# Patient Record
Sex: Female | Born: 1945 | Race: White | Hispanic: No | Marital: Married | Smoking: Never smoker
Health system: Southern US, Community
[De-identification: ages and names within clinical notes are randomized; demographics above are authoritative.]

## PROBLEM LIST (undated history)

## (undated) DIAGNOSIS — K219 Gastro-esophageal reflux disease without esophagitis: Secondary | ICD-10-CM

## (undated) DIAGNOSIS — I10 Essential (primary) hypertension: Secondary | ICD-10-CM

## (undated) DIAGNOSIS — C50919 Malignant neoplasm of unspecified site of unspecified female breast: Secondary | ICD-10-CM

## (undated) DIAGNOSIS — K449 Diaphragmatic hernia without obstruction or gangrene: Secondary | ICD-10-CM

## (undated) DIAGNOSIS — I712 Thoracic aortic aneurysm, without rupture, unspecified: Secondary | ICD-10-CM

## (undated) DIAGNOSIS — N200 Calculus of kidney: Secondary | ICD-10-CM

## (undated) DIAGNOSIS — M810 Age-related osteoporosis without current pathological fracture: Secondary | ICD-10-CM

## (undated) HISTORY — PX: CHOLECYSTECTOMY: SHX55

## (undated) HISTORY — PX: HEMORROIDECTOMY: SUR656

## (undated) HISTORY — DX: Thoracic aortic aneurysm, without rupture, unspecified: I71.20

## (undated) HISTORY — DX: Essential (primary) hypertension: I10

## (undated) HISTORY — PX: GASTRIC FUNDOPLICATION: SHX226

## (undated) HISTORY — DX: Calculus of kidney: N20.0

## (undated) HISTORY — DX: Gastro-esophageal reflux disease without esophagitis: K21.9

## (undated) HISTORY — DX: Malignant neoplasm of unspecified site of unspecified female breast: C50.919

## (undated) HISTORY — DX: Thoracic aortic aneurysm, without rupture: I71.2

## (undated) HISTORY — DX: Diaphragmatic hernia without obstruction or gangrene: K44.9

## (undated) HISTORY — PX: ROTATOR CUFF REPAIR: SHX139

## (undated) HISTORY — PX: BREAST RECONSTRUCTION: SHX9

---

## 1982-09-26 HISTORY — PX: ABDOMINAL HYSTERECTOMY: SHX81

## 1998-03-08 ENCOUNTER — Ambulatory Visit (HOSPITAL_COMMUNITY): Admission: RE | Admit: 1998-03-08 | Discharge: 1998-03-08 | Payer: Self-pay | Admitting: Neurological Surgery

## 1999-06-03 ENCOUNTER — Other Ambulatory Visit: Admission: RE | Admit: 1999-06-03 | Discharge: 1999-06-03 | Payer: Self-pay | Admitting: Obstetrics and Gynecology

## 1999-07-08 ENCOUNTER — Ambulatory Visit (HOSPITAL_COMMUNITY): Admission: RE | Admit: 1999-07-08 | Discharge: 1999-07-08 | Payer: Self-pay | Admitting: Gastroenterology

## 1999-08-05 ENCOUNTER — Encounter: Payer: Self-pay | Admitting: Obstetrics and Gynecology

## 1999-08-05 ENCOUNTER — Encounter: Admission: RE | Admit: 1999-08-05 | Discharge: 1999-08-05 | Payer: Self-pay | Admitting: Obstetrics and Gynecology

## 1999-10-29 ENCOUNTER — Ambulatory Visit (HOSPITAL_COMMUNITY): Admission: RE | Admit: 1999-10-29 | Discharge: 1999-10-29 | Payer: Self-pay | Admitting: Gastroenterology

## 1999-11-11 ENCOUNTER — Encounter: Admission: RE | Admit: 1999-11-11 | Discharge: 1999-11-11 | Payer: Self-pay | Admitting: Gastroenterology

## 1999-11-11 ENCOUNTER — Encounter: Payer: Self-pay | Admitting: Gastroenterology

## 2000-05-04 ENCOUNTER — Encounter: Payer: Self-pay | Admitting: Gastroenterology

## 2000-05-04 ENCOUNTER — Encounter: Admission: RE | Admit: 2000-05-04 | Discharge: 2000-05-04 | Payer: Self-pay | Admitting: Gastroenterology

## 2000-07-12 ENCOUNTER — Encounter: Admission: RE | Admit: 2000-07-12 | Discharge: 2000-07-12 | Payer: Self-pay | Admitting: Internal Medicine

## 2000-07-12 ENCOUNTER — Encounter: Payer: Self-pay | Admitting: Internal Medicine

## 2000-08-07 ENCOUNTER — Ambulatory Visit (HOSPITAL_COMMUNITY): Admission: RE | Admit: 2000-08-07 | Discharge: 2000-08-07 | Payer: Self-pay | Admitting: Gastroenterology

## 2000-08-10 ENCOUNTER — Encounter: Admission: RE | Admit: 2000-08-10 | Discharge: 2000-08-10 | Payer: Self-pay | Admitting: Obstetrics and Gynecology

## 2000-08-10 ENCOUNTER — Encounter: Payer: Self-pay | Admitting: Obstetrics and Gynecology

## 2000-08-24 ENCOUNTER — Encounter: Payer: Self-pay | Admitting: General Surgery

## 2000-08-24 ENCOUNTER — Encounter: Admission: RE | Admit: 2000-08-24 | Discharge: 2000-08-24 | Payer: Self-pay | Admitting: General Surgery

## 2000-10-06 ENCOUNTER — Inpatient Hospital Stay (HOSPITAL_COMMUNITY): Admission: RE | Admit: 2000-10-06 | Discharge: 2000-10-09 | Payer: Self-pay | Admitting: General Surgery

## 2000-12-05 ENCOUNTER — Ambulatory Visit (HOSPITAL_COMMUNITY): Admission: RE | Admit: 2000-12-05 | Discharge: 2000-12-05 | Payer: Self-pay | Admitting: Urology

## 2000-12-07 ENCOUNTER — Ambulatory Visit (HOSPITAL_COMMUNITY): Admission: RE | Admit: 2000-12-07 | Discharge: 2000-12-07 | Payer: Self-pay | Admitting: Urology

## 2000-12-07 ENCOUNTER — Encounter: Payer: Self-pay | Admitting: Urology

## 2000-12-11 ENCOUNTER — Encounter: Payer: Self-pay | Admitting: Emergency Medicine

## 2000-12-11 ENCOUNTER — Emergency Department (HOSPITAL_COMMUNITY): Admission: EM | Admit: 2000-12-11 | Discharge: 2000-12-11 | Payer: Self-pay | Admitting: Internal Medicine

## 2001-01-04 ENCOUNTER — Encounter: Payer: Self-pay | Admitting: Internal Medicine

## 2001-01-04 ENCOUNTER — Encounter: Admission: RE | Admit: 2001-01-04 | Discharge: 2001-01-04 | Payer: Self-pay | Admitting: Internal Medicine

## 2001-01-11 ENCOUNTER — Encounter: Payer: Self-pay | Admitting: General Surgery

## 2001-01-11 ENCOUNTER — Encounter: Admission: RE | Admit: 2001-01-11 | Discharge: 2001-01-11 | Payer: Self-pay | Admitting: General Surgery

## 2001-02-08 ENCOUNTER — Other Ambulatory Visit: Admission: RE | Admit: 2001-02-08 | Discharge: 2001-02-08 | Payer: Self-pay | Admitting: Obstetrics and Gynecology

## 2001-08-13 ENCOUNTER — Encounter: Admission: RE | Admit: 2001-08-13 | Discharge: 2001-08-13 | Payer: Self-pay | Admitting: Internal Medicine

## 2001-08-13 ENCOUNTER — Encounter: Payer: Self-pay | Admitting: Internal Medicine

## 2002-02-21 ENCOUNTER — Other Ambulatory Visit: Admission: RE | Admit: 2002-02-21 | Discharge: 2002-02-21 | Payer: Self-pay | Admitting: Obstetrics and Gynecology

## 2002-03-01 ENCOUNTER — Encounter: Payer: Self-pay | Admitting: Internal Medicine

## 2002-03-01 ENCOUNTER — Encounter: Admission: RE | Admit: 2002-03-01 | Discharge: 2002-03-01 | Payer: Self-pay | Admitting: Internal Medicine

## 2002-04-25 ENCOUNTER — Ambulatory Visit (HOSPITAL_COMMUNITY): Admission: RE | Admit: 2002-04-25 | Discharge: 2002-04-25 | Payer: Self-pay | Admitting: Gastroenterology

## 2002-09-06 ENCOUNTER — Encounter: Payer: Self-pay | Admitting: Obstetrics and Gynecology

## 2002-09-06 ENCOUNTER — Encounter: Admission: RE | Admit: 2002-09-06 | Discharge: 2002-09-06 | Payer: Self-pay | Admitting: Obstetrics and Gynecology

## 2003-05-01 ENCOUNTER — Encounter: Admission: RE | Admit: 2003-05-01 | Discharge: 2003-05-01 | Payer: Self-pay | Admitting: Internal Medicine

## 2003-05-01 ENCOUNTER — Encounter: Payer: Self-pay | Admitting: Internal Medicine

## 2003-11-26 ENCOUNTER — Encounter: Admission: RE | Admit: 2003-11-26 | Discharge: 2003-11-26 | Payer: Self-pay | Admitting: Internal Medicine

## 2003-11-28 ENCOUNTER — Ambulatory Visit (HOSPITAL_COMMUNITY)
Admission: RE | Admit: 2003-11-28 | Discharge: 2003-11-28 | Payer: Self-pay | Admitting: Thoracic Surgery (Cardiothoracic Vascular Surgery)

## 2004-12-10 ENCOUNTER — Ambulatory Visit (HOSPITAL_COMMUNITY): Admission: RE | Admit: 2004-12-10 | Discharge: 2004-12-10 | Payer: Self-pay | Admitting: Gastroenterology

## 2004-12-17 ENCOUNTER — Encounter: Admission: RE | Admit: 2004-12-17 | Discharge: 2004-12-17 | Payer: Self-pay | Admitting: Obstetrics and Gynecology

## 2004-12-24 ENCOUNTER — Encounter: Admission: RE | Admit: 2004-12-24 | Discharge: 2004-12-24 | Payer: Self-pay | Admitting: Internal Medicine

## 2006-01-24 ENCOUNTER — Encounter: Admission: RE | Admit: 2006-01-24 | Discharge: 2006-01-24 | Payer: Self-pay | Admitting: Internal Medicine

## 2006-01-30 ENCOUNTER — Encounter: Admission: RE | Admit: 2006-01-30 | Discharge: 2006-01-30 | Payer: Self-pay | Admitting: Dermatology

## 2006-12-14 ENCOUNTER — Encounter: Admission: RE | Admit: 2006-12-14 | Discharge: 2006-12-14 | Payer: Self-pay | Admitting: Gastroenterology

## 2007-01-11 ENCOUNTER — Encounter: Admission: RE | Admit: 2007-01-11 | Discharge: 2007-01-11 | Payer: Self-pay | Admitting: Gastroenterology

## 2007-02-14 ENCOUNTER — Encounter: Admission: RE | Admit: 2007-02-14 | Discharge: 2007-02-14 | Payer: Self-pay | Admitting: Obstetrics and Gynecology

## 2007-02-20 ENCOUNTER — Encounter: Admission: RE | Admit: 2007-02-20 | Discharge: 2007-02-20 | Payer: Self-pay | Admitting: Obstetrics and Gynecology

## 2007-02-27 ENCOUNTER — Ambulatory Visit (HOSPITAL_COMMUNITY): Admission: RE | Admit: 2007-02-27 | Discharge: 2007-02-28 | Payer: Self-pay | Admitting: General Surgery

## 2007-02-27 ENCOUNTER — Encounter (INDEPENDENT_AMBULATORY_CARE_PROVIDER_SITE_OTHER): Payer: Self-pay | Admitting: General Surgery

## 2007-02-27 ENCOUNTER — Encounter: Admission: RE | Admit: 2007-02-27 | Discharge: 2007-02-27 | Payer: Self-pay | Admitting: General Surgery

## 2007-04-24 ENCOUNTER — Encounter: Admission: RE | Admit: 2007-04-24 | Discharge: 2007-04-24 | Payer: Self-pay | Admitting: Internal Medicine

## 2008-03-03 ENCOUNTER — Encounter: Admission: RE | Admit: 2008-03-03 | Discharge: 2008-03-03 | Payer: Self-pay | Admitting: Obstetrics and Gynecology

## 2008-03-10 ENCOUNTER — Encounter: Admission: RE | Admit: 2008-03-10 | Discharge: 2008-03-10 | Payer: Self-pay | Admitting: Obstetrics and Gynecology

## 2008-03-13 ENCOUNTER — Encounter: Admission: RE | Admit: 2008-03-13 | Discharge: 2008-03-13 | Payer: Self-pay | Admitting: Obstetrics and Gynecology

## 2008-03-26 HISTORY — PX: MASTECTOMY: SHX3

## 2008-04-04 ENCOUNTER — Encounter (INDEPENDENT_AMBULATORY_CARE_PROVIDER_SITE_OTHER): Payer: Self-pay | Admitting: General Surgery

## 2008-04-05 ENCOUNTER — Inpatient Hospital Stay (HOSPITAL_COMMUNITY): Admission: RE | Admit: 2008-04-05 | Discharge: 2008-04-07 | Payer: Self-pay | Admitting: General Surgery

## 2008-04-11 ENCOUNTER — Ambulatory Visit: Payer: Self-pay | Admitting: Oncology

## 2008-04-23 LAB — CBC WITH DIFFERENTIAL/PLATELET
BASO%: 0.8 % (ref 0.0–2.0)
EOS%: 4.9 % (ref 0.0–7.0)
HCT: 36.9 % (ref 34.8–46.6)
LYMPH%: 41.9 % (ref 14.0–48.0)
MCH: 32.1 pg (ref 26.0–34.0)
MCHC: 33.4 g/dL (ref 32.0–36.0)
MCV: 96.3 fL (ref 81.0–101.0)
MONO#: 0.3 10*3/uL (ref 0.1–0.9)
MONO%: 6.6 % (ref 0.0–13.0)
NEUT%: 45.8 % (ref 39.6–76.8)
Platelets: 331 10*3/uL (ref 145–400)
RBC: 3.83 10*6/uL (ref 3.70–5.32)
WBC: 4.7 10*3/uL (ref 3.9–10.0)

## 2008-04-24 LAB — LACTATE DEHYDROGENASE: LDH: 137 U/L (ref 94–250)

## 2008-04-24 LAB — COMPREHENSIVE METABOLIC PANEL
ALT: 22 U/L (ref 0–35)
AST: 20 U/L (ref 0–37)
Alkaline Phosphatase: 58 U/L (ref 39–117)
Creatinine, Ser: 0.78 mg/dL (ref 0.40–1.20)
Sodium: 140 mEq/L (ref 135–145)
Total Bilirubin: 0.3 mg/dL (ref 0.3–1.2)
Total Protein: 7.3 g/dL (ref 6.0–8.3)

## 2008-07-08 ENCOUNTER — Ambulatory Visit: Payer: Self-pay | Admitting: Oncology

## 2008-07-10 LAB — CBC WITH DIFFERENTIAL/PLATELET
Eosinophils Absolute: 0.1 10*3/uL (ref 0.0–0.5)
HCT: 37.6 % (ref 34.8–46.6)
LYMPH%: 48 % (ref 14.0–48.0)
MCV: 93.8 fL (ref 81.0–101.0)
MONO#: 0.5 10*3/uL (ref 0.1–0.9)
MONO%: 9.1 % (ref 0.0–13.0)
NEUT#: 2.3 10*3/uL (ref 1.5–6.5)
NEUT%: 41.4 % (ref 39.6–76.8)
Platelets: 234 10*3/uL (ref 145–400)
RBC: 4.01 10*6/uL (ref 3.70–5.32)

## 2008-07-20 ENCOUNTER — Emergency Department (HOSPITAL_COMMUNITY): Admission: EM | Admit: 2008-07-20 | Discharge: 2008-07-20 | Payer: Self-pay | Admitting: Emergency Medicine

## 2008-10-06 ENCOUNTER — Encounter: Admission: RE | Admit: 2008-10-06 | Discharge: 2008-10-06 | Payer: Self-pay | Admitting: Oncology

## 2008-10-07 ENCOUNTER — Ambulatory Visit: Payer: Self-pay | Admitting: Oncology

## 2008-10-09 LAB — RESEARCH LABS

## 2008-10-21 ENCOUNTER — Ambulatory Visit: Payer: Self-pay | Admitting: Thoracic Surgery (Cardiothoracic Vascular Surgery)

## 2008-12-02 ENCOUNTER — Ambulatory Visit (HOSPITAL_BASED_OUTPATIENT_CLINIC_OR_DEPARTMENT_OTHER): Admission: RE | Admit: 2008-12-02 | Discharge: 2008-12-03 | Payer: Self-pay | Admitting: Plastic Surgery

## 2009-01-29 ENCOUNTER — Ambulatory Visit: Payer: Self-pay | Admitting: Oncology

## 2009-02-06 ENCOUNTER — Encounter: Admission: RE | Admit: 2009-02-06 | Discharge: 2009-02-06 | Payer: Self-pay | Admitting: Oncology

## 2009-04-01 ENCOUNTER — Encounter: Payer: Self-pay | Admitting: Oncology

## 2009-04-01 ENCOUNTER — Ambulatory Visit: Admission: RE | Admit: 2009-04-01 | Discharge: 2009-04-01 | Payer: Self-pay | Admitting: Oncology

## 2009-04-16 ENCOUNTER — Ambulatory Visit (HOSPITAL_COMMUNITY): Admission: RE | Admit: 2009-04-16 | Discharge: 2009-04-17 | Payer: Self-pay | Admitting: Plastic Surgery

## 2009-04-16 ENCOUNTER — Encounter (INDEPENDENT_AMBULATORY_CARE_PROVIDER_SITE_OTHER): Payer: Self-pay | Admitting: Plastic Surgery

## 2009-04-23 ENCOUNTER — Ambulatory Visit (HOSPITAL_COMMUNITY): Admission: RE | Admit: 2009-04-23 | Discharge: 2009-04-23 | Payer: Self-pay | Admitting: Internal Medicine

## 2009-04-25 ENCOUNTER — Inpatient Hospital Stay (HOSPITAL_COMMUNITY): Admission: EM | Admit: 2009-04-25 | Discharge: 2009-04-25 | Payer: Self-pay | Admitting: Emergency Medicine

## 2009-07-28 ENCOUNTER — Ambulatory Visit: Payer: Self-pay | Admitting: Oncology

## 2009-07-30 LAB — CBC WITH DIFFERENTIAL/PLATELET
BASO%: 0.3 % (ref 0.0–2.0)
EOS%: 0.5 % (ref 0.0–7.0)
MCH: 32.5 pg (ref 25.1–34.0)
MCHC: 33.5 g/dL (ref 31.5–36.0)
MONO#: 0.3 10*3/uL (ref 0.1–0.9)
NEUT%: 61 % (ref 38.4–76.8)
RBC: 3.86 10*6/uL (ref 3.70–5.45)
RDW: 13.6 % (ref 11.2–14.5)
WBC: 6.1 10*3/uL (ref 3.9–10.3)
lymph#: 2 10*3/uL (ref 0.9–3.3)

## 2009-07-31 LAB — COMPREHENSIVE METABOLIC PANEL
ALT: 11 U/L (ref 0–35)
AST: 18 U/L (ref 0–37)
CO2: 26 mEq/L (ref 19–32)
Calcium: 9 mg/dL (ref 8.4–10.5)
Chloride: 108 mEq/L (ref 96–112)
Creatinine, Ser: 0.82 mg/dL (ref 0.40–1.20)
Potassium: 3.8 mEq/L (ref 3.5–5.3)
Sodium: 142 mEq/L (ref 135–145)
Total Protein: 6.9 g/dL (ref 6.0–8.3)

## 2009-09-01 ENCOUNTER — Ambulatory Visit (HOSPITAL_COMMUNITY): Admission: RE | Admit: 2009-09-01 | Discharge: 2009-09-01 | Payer: Self-pay | Admitting: Plastic Surgery

## 2009-11-26 ENCOUNTER — Encounter: Payer: Self-pay | Admitting: Cardiology

## 2010-01-27 ENCOUNTER — Ambulatory Visit: Payer: Self-pay | Admitting: Oncology

## 2010-01-28 LAB — COMPREHENSIVE METABOLIC PANEL
Albumin: 4.3 g/dL (ref 3.5–5.2)
Alkaline Phosphatase: 58 U/L (ref 39–117)
CO2: 21 mEq/L (ref 19–32)
Glucose, Bld: 99 mg/dL (ref 70–99)
Potassium: 3.6 mEq/L (ref 3.5–5.3)
Sodium: 142 mEq/L (ref 135–145)
Total Protein: 7.3 g/dL (ref 6.0–8.3)

## 2010-01-28 LAB — CBC WITH DIFFERENTIAL/PLATELET
Eosinophils Absolute: 0.1 10*3/uL (ref 0.0–0.5)
MONO#: 0.4 10*3/uL (ref 0.1–0.9)
MONO%: 8.8 % (ref 0.0–14.0)
NEUT#: 1.9 10*3/uL (ref 1.5–6.5)
RBC: 3.78 10*6/uL (ref 3.70–5.45)
RDW: 13.5 % (ref 11.2–14.5)
WBC: 4.5 10*3/uL (ref 3.9–10.3)

## 2010-01-28 LAB — VITAMIN D 25 HYDROXY (VIT D DEFICIENCY, FRACTURES): Vit D, 25-Hydroxy: 42 ng/mL (ref 30–89)

## 2010-05-17 ENCOUNTER — Ambulatory Visit (HOSPITAL_COMMUNITY): Admission: RE | Admit: 2010-05-17 | Discharge: 2010-05-17 | Payer: Self-pay | Admitting: Internal Medicine

## 2010-05-25 ENCOUNTER — Ambulatory Visit: Payer: Self-pay | Admitting: Thoracic Surgery (Cardiothoracic Vascular Surgery)

## 2010-05-25 ENCOUNTER — Encounter
Admission: RE | Admit: 2010-05-25 | Discharge: 2010-05-25 | Payer: Self-pay | Admitting: Thoracic Surgery (Cardiothoracic Vascular Surgery)

## 2010-08-04 ENCOUNTER — Ambulatory Visit: Payer: Self-pay | Admitting: Oncology

## 2010-08-06 LAB — CBC WITH DIFFERENTIAL/PLATELET
BASO%: 0.7 % (ref 0.0–2.0)
EOS%: 0.5 % (ref 0.0–7.0)
HCT: 39.9 % (ref 34.8–46.6)
LYMPH%: 44.9 % (ref 14.0–49.7)
MCH: 31.3 pg (ref 25.1–34.0)
MCHC: 32.7 g/dL (ref 31.5–36.0)
MCV: 95.5 fL (ref 79.5–101.0)
MONO%: 8.5 % (ref 0.0–14.0)
NEUT%: 45.4 % (ref 38.4–76.8)
lymph#: 2.3 10*3/uL (ref 0.9–3.3)

## 2010-08-07 LAB — COMPREHENSIVE METABOLIC PANEL
ALT: 13 U/L (ref 0–35)
AST: 20 U/L (ref 0–37)
Alkaline Phosphatase: 54 U/L (ref 39–117)
Creatinine, Ser: 0.68 mg/dL (ref 0.40–1.20)
Total Bilirubin: 0.6 mg/dL (ref 0.3–1.2)

## 2010-08-25 ENCOUNTER — Encounter
Admission: RE | Admit: 2010-08-25 | Discharge: 2010-09-15 | Payer: Self-pay | Source: Home / Self Care | Attending: Oncology | Admitting: Oncology

## 2010-10-16 ENCOUNTER — Other Ambulatory Visit: Payer: Self-pay | Admitting: Oncology

## 2010-10-16 DIAGNOSIS — Z79899 Other long term (current) drug therapy: Secondary | ICD-10-CM

## 2010-10-16 DIAGNOSIS — Z853 Personal history of malignant neoplasm of breast: Secondary | ICD-10-CM

## 2010-10-17 ENCOUNTER — Encounter: Payer: Self-pay | Admitting: Obstetrics and Gynecology

## 2010-10-17 ENCOUNTER — Encounter: Payer: Self-pay | Admitting: Thoracic Surgery (Cardiothoracic Vascular Surgery)

## 2010-12-28 LAB — CBC
HCT: 39.4 % (ref 36.0–46.0)
Hemoglobin: 13.1 g/dL (ref 12.0–15.0)
MCHC: 33.3 g/dL (ref 30.0–36.0)
RBC: 4.05 MIL/uL (ref 3.87–5.11)
RDW: 13.5 % (ref 11.5–15.5)

## 2010-12-28 LAB — BASIC METABOLIC PANEL
CO2: 24 mEq/L (ref 19–32)
GFR calc Af Amer: >60 mL/min (ref >60)
Glucose, Bld: 91 mg/dL (ref 70–99)
Potassium: 4.1 mEq/L (ref 3.5–5.1)
Sodium: 139 mEq/L (ref 135–145)

## 2010-12-31 ENCOUNTER — Encounter: Payer: Self-pay | Admitting: Physician Assistant

## 2011-01-02 LAB — CBC
HCT: 37.2 % (ref 36.0–46.0)
HCT: 39.3 % (ref 36.0–46.0)
Hemoglobin: 12.9 g/dL (ref 12.0–15.0)
MCHC: 32.9 g/dL (ref 30.0–36.0)
Platelets: 205 10*3/uL (ref 150–400)
Platelets: 216 10*3/uL (ref 150–400)
RDW: 13 % (ref 11.5–15.5)
RDW: 13.6 % (ref 11.5–15.5)
WBC: 3.5 10*3/uL — ABNORMAL LOW (ref 4.0–10.5)

## 2011-01-02 LAB — BASIC METABOLIC PANEL
BUN: 13 mg/dL (ref 6–23)
BUN: 15 mg/dL (ref 6–23)
CO2: 26 mEq/L (ref 19–32)
Calcium: 9 mg/dL (ref 8.4–10.5)
Calcium: 9.7 mg/dL (ref 8.4–10.5)
GFR calc non Af Amer: >60 mL/min (ref >60)
GFR calc non Af Amer: >60 mL/min (ref >60)
Glucose, Bld: 109 mg/dL — ABNORMAL HIGH (ref 70–99)
Glucose, Bld: 96 mg/dL (ref 70–99)
Potassium: 3.3 mEq/L — ABNORMAL LOW (ref 3.5–5.1)
Potassium: 4.3 mEq/L (ref 3.5–5.1)

## 2011-01-02 LAB — POCT CARDIAC MARKERS
CKMB, poc: <1 ng/mL — ABNORMAL LOW (ref 1.0–8.0)
Myoglobin, poc: 23.1 ng/mL (ref 12–200)
Troponin i, poc: <0.05 ng/mL (ref 0.00–0.09)
Troponin i, poc: <0.05 ng/mL (ref 0.00–0.09)

## 2011-01-02 LAB — TROPONIN I: Troponin I: 0.01 ng/mL (ref 0.00–0.06)

## 2011-01-02 LAB — DIFFERENTIAL
Basophils Absolute: 0 10*3/uL (ref 0.0–0.1)
Eosinophils Relative: 4 % (ref 0–5)
Lymphocytes Relative: 10 % — ABNORMAL LOW (ref 12–46)
Lymphs Abs: 0.3 10*3/uL — ABNORMAL LOW (ref 0.7–4.0)
Neutro Abs: 2.8 10*3/uL (ref 1.7–7.7)
Neutrophils Relative %: 81 % — ABNORMAL HIGH (ref 43–77)

## 2011-01-02 LAB — CK TOTAL AND CKMB (NOT AT ARMC): CK, MB: 0.6 ng/mL (ref 0.3–4.0)

## 2011-01-04 ENCOUNTER — Ambulatory Visit (INDEPENDENT_AMBULATORY_CARE_PROVIDER_SITE_OTHER): Payer: 59 | Admitting: Physician Assistant

## 2011-01-04 ENCOUNTER — Encounter: Payer: Self-pay | Admitting: Physician Assistant

## 2011-01-04 DIAGNOSIS — R079 Chest pain, unspecified: Secondary | ICD-10-CM

## 2011-01-04 NOTE — Progress Notes (Signed)
Exercise Treadmill Test  Pre-Exercise Testing Evaluation Rhythm: normal sinus  Rate: 75   PR:  .16 QRS:.08  QT:  .37 QTc: .42     Test  Exercise Tolerance Test Ordering MD: Jarome Matin M.D  Interpreting MD:  Tereso Newcomer PA-C  Unique Test No: 1  Treadmill:  1  Indication for ETT: chest pain - rule out ischemia  Contraindication to ETT: No   Stress Modality: exercise - treadmill  Cardiac Imaging Performed: non   Protocol: standard Bruce - maximal  Max BP:  175/71   85% MPR (bpm):  133  MPHR obtained (bpm):  164 % MPHR obtained:  103%  Reached 85% MPHR (min:sec):  7:00 Total Exercise Time (min-sec):  10:00  Workload in METS:  11.7 Borg Scale: 17  Reason ETT Terminated:  patient's desire to stop    ST Segment Analysis At Rest: normal ST segments - no evidence of significant ST depression With Exercise: non-specific ST changes  Other Information Arrhythmia:  Yes Angina during ETT:  absent (0) Quality of ETT:  diagnostic  ETT Interpretation:  normal - no evidence of ischemia by ST analysis  Comments: Good exercise tolerance. Normal BP response. No chest pain during exercise.  Chest pain noted in recovery. No ECG changes to suggest ischemia. PVC and 2 ventricular couplets noted at maximal exercise.  Recommendations: Follow up with Dr. Eloise Harman.

## 2011-01-06 LAB — BASIC METABOLIC PANEL
BUN: 15 mg/dL (ref 6–23)
Chloride: 110 mEq/L (ref 96–112)
GFR calc Af Amer: >60 mL/min (ref >60)
GFR calc non Af Amer: >60 mL/min (ref >60)
Potassium: 4.2 mEq/L (ref 3.5–5.1)

## 2011-02-08 ENCOUNTER — Other Ambulatory Visit: Payer: Self-pay

## 2011-02-08 NOTE — Discharge Summary (Signed)
NAMEAMBERLEIGH, [name]                 ACCOUNT NO.:  [id number]   MEDICAL RECORD NO.:  [id number]          PATIENT TYPE:  INP   LOCATION:  5152                         FACILITY:  [facility]   PHYSICIAN:  Angelia Mould. Derrell Lolling, M.D.DATE OF BIRTH:  21-May-1946   DATE OF ADMISSION:  04/04/2008  DATE OF DISCHARGE:  04/07/2008                               DISCHARGE SUMMARY   FINAL DIAGNOSES:  1. Invasive ductal carcinoma, left breast, stage T1b, N0, ER positive,      PR positive.  2. Family history of ovarian cancer.  3. Family history of breast cancer.  4. Status post multiple breast biopsies.   OPERATIONS PERFORMED:  1. Injection of blue dye, left breast.  2. Left axillary sentinel lymph node mapping and biopsy.  3. Bilateral total mastectomies, date April 04, 2008.   HISTORY:  This is a 65 year old white female who has had multiple breast  biopsies in the past including multiple benign intraductal papillomas on  the left breast.  The recent imaging studies showed an 8-mm irregular  mass in the left breast in the medial and posterior position.  Stereotactic core biopsy was performed showing invasive mammary  carcinoma.  This was ER and PR positive and HER-2 negative.  She was  evaluated as an outpatient.  She and her husband had a long counseling  session with me and they were quite certain that they wanted to go ahead  with bilateral mastectomies.  We elected to cancel her MRI at that  point.   HOSPITAL COURSE:  On the day of admission, the patient was brought to  operating room and underwent bilateral total mastectomies and left  axillary sentinel node biopsy.  Final pathology report showed no  residual cancer in the left breast and negative lymph nodes.  The final  pathology report staged this is as a T1b, N0 lesion which was hormone  receptor positive.   Postoperatively, she did fairly well.  She was uncomfortable and had to  remain hospitalized for a couple of days, but finally got   feeling well  and was ready to go home on April 07, 2008.  At that time, she was  finally tolerating a diet, ambulating in the halls, and wounds were  clean, dry, flat, and healthy.  She was draining more than 100 mL of  light serosanguineous fluid from each side and so all of her drains were  left in place.  She was asked to follow up with me in the office in 3-4  days.  She was given prescription for Vicodin for pain, multivitamins  with iron, and laxative.      Angelia Mould. Derrell Lolling, M.D.  Electronically Signed     HMI/MEDQ  D:  05/18/2008  T:  05/18/2008  Job:  [id number]   cc:   Lenon Curt. Chilton Si, M.D.

## 2011-02-08 NOTE — Op Note (Signed)
[name], [name]                 ACCOUNT NO.:  [id number]   MEDICAL RECORD NO.:  [id number]          PATIENT TYPE:  AMB   LOCATION:  SDS                          FACILITY:  [facility]   PHYSICIAN:  Angelia Mould. Derrell Lolling, M.D.DATE OF BIRTH:  [birth year]   DATE OF PROCEDURE:  02/27/2007  DATE OF DISCHARGE:                               OPERATIVE REPORT   PREOPERATIVE DIAGNOSIS:  Left breast mass, suspect multiple intraductal  papillomas.   POSTOPERATIVE DIAGNOSIS:  Left breast mass, suspect multiple intraductal  papillomas.   OPERATION PERFORMED:  Excision left breast mass with needle localization  and specimen mammogram.   SURGEON:  Angelia Mould. Derrell Lolling, M.D.   OPERATIVE INDICATIONS:  This is a 64 year old white female who has had  bilateral breast biopsies in the past for benign disease.  She had  mammograms recently which were for screening purposes and subsequently  had diagnostic mammograms and ultrasound on the left.  It appears that  she has multiple round partially obscured partially circumscribed  nodules in the medial aspect of the left breast which by sonogram showed  a dilated ductal system at the 10 o'clock position and within the duct 2  cm from the nipple, there were two small hyperechoic masses.  Dr. Deboraha Sprang  feels that these are multiple intraductal papillomas; and should be  excess excised to exclude neoplasia.   On exam the patient has a well-healed circumareolar incision in the left  breast medially.  There is a little bit of thickening at the 10 o'clock  and at the 11 o'clock position.  The patient was advised to have  surgery.  She underwent a needle localization, this morning; and is  brought to operating room electively.   OPERATIVE TECHNIQUE:  The patient was brought to the operating room, and  placed supine on the operating table.  The patient was identified as  correct patient, correct procedure, and correct site.  General  anesthesia was induced using an LMA  device.   The left breast was prepped and draped in a sterile fashion.  We  reviewed the needle localization films.  We examined the patient and saw  two localizing wires, one entering close to the areolar margin; and one  more medial and superior, marking the proximal and distal extent of the  involved duct.  The left breast was prepped and draped in a sterile  fashion.  Then 0.5% Marcaine with epinephrine was used as a local  infiltration anesthetic.  A curved circumareolar incision was made  medially and superomedially; and the left breast, going through the  previous scar.  Dissection was carried down into the subcutaneous  tissue; and ultimately into the breast tissue.  We dissected medially  and laterally a little bit inferiorly and superiorly some, to get around  both wires.   After several steps of this we were able to get completely around both  wire tips.  We marked the specimen with silk sutures to orient the  pathologist and the radiologist.  Specimen mammogram showed that we had  removed all of the areas in question.  The  specimen was sent for routine  histology.  Hemostasis was excellent and achieved with electrocautery.  The wound was irrigated with saline.  The breast tissue was closed with  a few interrupted sutures of  3-0 Vicryl; and the skin closed with a running subcuticular suture of 4-  0 Monocryl and Steri-Strips.  Clean bandages were placed; and the  patient taken to the recovery room in stable condition.  Estimated blood  loss was about 10 mL.  Complications none.  Sponge, needle, and  instrument counts were correct.      Angelia Mould. Derrell Lolling, M.D.  Electronically Signed     HMI/MEDQ  D:  02/27/2007  T:  02/27/2007  Job:  [id number]   cc:   Lenon Curt. Chilton Si, M.D.  Daryl Eastern, M.D.

## 2011-02-08 NOTE — Consult Note (Signed)
NEW PATIENT CONSULTATION   [name], [name]  DOB:  January 15, 1946                                        October 21, 2008  CHART #:  [id number]   REASON FOR VISIT:  A 4-cm ascending aorta.   The patient is a 65 year old woman who sent for consultation by Dr.  Ruthann Cancer.  She was recently scheduled to have breast  reconstruction.  I think an expander was going to be placed by Dr.  Stephens November.  CT scan of the chest has been done and someone noted she  had a 4-cm ascending aorta.  Her surgery was cancelled and she was now  sent for surgical clearance.  She has been having some pain.  She  describes the pain between her shoulder blades often at night.  It is  sharp, stabbing type of pain.  She also has pain in her right arm.  It  is really hard to bend down.  This did not sound exertional-like angina,  but it is sporadic.  It is not constant.  It comes and goes.  Other than  that she has been feeling well.  She had been treated for breast cancer  and was looking forward having this expander placement, so she can  complete her breast reconstruction.  I had seen the patient back in 5  years ago.  At that time, she had been having vague chest pain for 6  months and the CT scan showed a large aortic root approximately 3.5 cm  in diameter that was not felt to be the cause of her pain.  We did do an  echocardiogram to make sure that she did not have any aortic  insufficiency and there was none present.   She is supposed to have come back and see me 6 months later, but did not  return for followup for uncertain reasons.   PAST MEDICAL HISTORY:  Significant for dilated ascending aorta,  hypertension, hiatal hernia, previous fundoplication, nephrolithiasis,  breast cancer status post bilateral mastectomies, and a left single node  sampling.   CURRENT MEDICATIONS:  Lotrel 5/10 one tablet daily, tamoxifen 20 mg  daily.  She takes Xanax p.r.n. for anxiety.   ALLERGIES:   She has allergies to Ceclor and sulfa.   SOCIAL HISTORY:  She is a Engineer, civil (consulting).  She is married, with 1 child.  Lives  with her husband.  She does not smoke or drink.   REVIEW OF SYSTEMS:  Chest discomfort, right arm pain, pain between the  shoulder blades, and shortness of breath with exertion.   PHYSICAL EXAMINATION:  GENERAL:  The patient is a 65 year old white  female, in no acute distress.  VITAL SIGNS:  Her blood pressure is 128/85, pulse 82, and respirations  are 18.  Her ox saturation 95% on room air.  NEUROLOGIC:  She is alert and oriented x3 with no focal deficits.  HEENT:  Unremarkable.  NECK:  Supple without thyromegaly, adenopathy, or bruits.  CARDIAC:  Regular rate and rhythm.  Normal S1 and S2.  I do not rub,  murmur, or gallop.  LUNGS:  Clear.  EXTREMITIES:  Peripheral pulses are 2+ throughout.   CT scan is reviewed and compared to a CT from 2007.  There has been no  interval change and maximum diameter of the aorta is 4 cm.  Looking back  on CT of the abdomen from April 2008, did not go completely through the  area of dilated aorta, but the aorta appears to be of similar size.  There was also an MRI of the spine done from 2006, which the aorta  appears to be of similar size.   The patient is a 65 year old lady.  She has had a dilated ascending  aorta for at least 5 years was 3.5 cm in 2005, is now 4 cm consistent  with a very slow increase in size over that time.  The main stay for  treatment in the setting is blood pressure control and her blood  pressure is within normal limits today.  There is no indication for  surgery.  I would recommend followup in 1 year with a CT angio of the  chest and to rule out any increase in the size of the aneurysm.  I did  discuss with the patient and her husband the nature of the problem,  nature of the surgery, to correct the problem.  Again, there is no  indication for that at this time as the risk of surgery outweighs the  risk of  either dissection or rupture in a 4-cm aorta.  There is no  contraindication from the standpoint of her ascending aorta in regards  to her planned breast surgery.  I do not have full excess to all of her  records, but I do think it would still be a reasonable idea to consider  noninvasive workup for coronary artery disease given her unusual pain,  although clinically this  does not sound like angina.  Pain is between the scapula and down the  right arm, again it did not sound like angina clinically, but has no  other obvious explanation for my findings.   Salvatore Decent Dorris Fetch, M.D.  Electronically Signed   SCH/MEDQ  D:  10/21/2008  T:  10/21/2008  Job:  [id number]   cc:   Valentino Hue. Magrinat, M.D.  Greta Doom. Jarold Motto, MD  Consuello Bossier., M.D.  Angelia Mould. Derrell Lolling, M.D.

## 2011-02-08 NOTE — Op Note (Signed)
[name], PREISLER                 ACCOUNT NO.:  [id number]   MEDICAL RECORD NO.:  [id number]          PATIENT TYPE:  OIB   LOCATION:  5152                         FACILITY:  [facility]   PHYSICIAN:  Consuello Bossier., M.D.DATE OF BIRTH:  [birth year]   DATE OF PROCEDURE:  04/16/2009  DATE OF DISCHARGE:                               OPERATIVE REPORT   PREOPERATIVE DIAGNOSIS:  Personal history, carcinoma of breasts and  acquired evidence of both breasts with tissue expanders in place.   POSTOPERATIVE DIAGNOSIS:  Personal history, carcinoma of breasts and  acquired evidence of both breasts with tissue expanders in place.   OPERATION PERFORMED:  Bilateral delayed breast reconstruction with  removal of tissue expanders, capsulotomies, and replacement with Mentor,  smooth-walled silicone gel 5 mL, high profile, 500 mL right and 550 mL  left.   SURGEON:  Pleas Patricia, MD   ANESTHESIA:  General endotracheal.   FINDINGS:  The patient had bilateral mastectomies with carcinoma of the  breast and had tissue expanders in place and the volume was 500 on the  right and 550 on the left, and it was elected to proceed with the above  surgical procedure.   PROCEDURE IN DETAIL:  The patient was brought to the operating room  having been marked off with the capsulotomies to have a slight medial  capsulotomy bilaterally and medially and inferiorly on the left side.  She was marked off the total planned excision of the medial aspect of  transverse mastectomy incisions.  She was prepped with Betadine and  draped sterilely, anesthetized, and given a general endotracheal  anesthetic.  The scars were excised.  The skin was undermined at the  level of the muscle.  The pectoralis major muscle was split in the  direction of its fibers, anterior capsule opened, and the tissue  expanders were removed.  With fiberoptic illumination on the right side,  a somewhat medial capsulotomy was performed and 500 mL  of gel-filled  prosthesis placed.  On the left side, the similar procedure was carried  out with somewhat lowering of the implants based on the left in terms of  the capsulotomy as well.  Bleeding was controlled with electrocautery  and there was noted to be good hemostasis.  The 550 mL high profile  smooth-walled gel-filled prosthesis was placed.  The muscle was closed  bilaterally with interrupted #3-0 Vicryl and subcutaneous tissues with  interrupted #3-0 Monocryl followed by running subcuticular #4-0 Monocryl  to the skin.  Steri-Strips, Xeroform, 4 x 8s, ABD, and circumthoracic  Ace bandage were applied.  The patient tolerated the procedure well and  subsequently discharged from the operating room to the recovery room.      Consuello Bossier., M.D.  Electronically Signed    HH/MEDQ  D:  04/16/2009  T:  04/17/2009  Job:  [id number]

## 2011-02-08 NOTE — Assessment & Plan Note (Signed)
OFFICE VISIT   [name], [name]  DOB:  1946/08/28                                        May 25, 2010  CHART #:  [id number]   HISTORY:  The patient is a 65 year old woman with an ascending aortic  aneurysm.  This had first been noted on a CT about 6 years ago when she  had an aortic root that was 3.5 cm in diameter.  She then did not return  for followup but was later noted on a CT scan of the chest to have a 4-  cm ascending aorta in January 2010.  There was no indication for surgery  at that time.  We recommended that she come back for followup in a year  with repeat CT angio.  She was also noted to have a 5.7-mm pulmonary  nodule on the superior segment of the right lower lobe which was not  particularly suspicious.  Plan was to follow up that with a CT as well.  She says that over the past year she has had 3 separate surgeries  related to her breast reconstruction with some type of gel implants.  She occasionally has pain on both sides of the chest under the breasts  which she thinks is probably related that, otherwise has not had any  time of chest pain.   PAST MEDICAL HISTORY:  Significant for breast cancer, bilateral  mastectomies and reconstruction, ascending aortic aneurysm,  hypertension, hiatal hernia, previous fundoplication.   CURRENT MEDICATIONS:  1. Lotrel 5/10 one tablet daily.  2. Femara 2.5 mg daily.  3. Calcium plus D 1 tablet daily.  4. She uses Xanax p.r.n.   She is no longer on tamoxifen.   ALLERGIES:  CECLOR and SULFA.   REVIEW OF SYSTEMS:  See HPI.   PHYSICAL EXAMINATION:  General:  The patient is a 65 year old woman in  no acute distress.  She is well developed and well nourished.  Vital  Signs:  Blood pressure 120/68, pulse 76, respirations are 20, oxygen  saturation is 96% on room air.  Neck:  Supple without thyromegaly,  adenopathy, or bruits.  Cardiac:  Regular rate and rhythm.  Normal S1  and S2.  No rubs, murmurs,  or gallops.  Lungs:  Clear with equal breath  sounds bilaterally.  She has 2+ pulses throughout.  There is no  peripheral edema.   DIAGNOSTIC TESTS:  CT of the chest shows no change in the 4-cm ascending  aorta not has there been any change in the 5.7-mm pulmonary nodule in  the right superior segment.   IMPRESSION:  No interval change in approximately 18 months of her  ascending aorta.  We will plan to scan her again in 1 year.  We will do  a repeat CT.  It has been 18 months and the lung nodule has not changed  either.  This is probably an intrapulmonary lymph node or an old scar,  but we will reassess that at the same time.  Her blood pressure is well  controlled.  No other recommendations at this time.   Salvatore Decent Dorris Fetch, M.D.  Electronically Signed   SCH/MEDQ  D:  05/25/2010  T:  05/26/2010  Job:  [id number]   cc:   Barry Dienes. Eloise Harman, M.D.  Valentino Hue. Magrinat, M.D.  Angelia Mould. Derrell Lolling, M.D.

## 2011-02-08 NOTE — Op Note (Signed)
NAMEKRYSTALYNN, [name]                 ACCOUNT NO.:  [id number]   MEDICAL RECORD NO.:  [id number]          PATIENT TYPE:  AMB   LOCATION:  DSC                          FACILITY:  [facility]   PHYSICIAN:  Consuello Bossier., M.D.DATE OF BIRTH:  [birth year]   DATE OF PROCEDURE:  12/02/2008  DATE OF DISCHARGE:                               OPERATIVE REPORT   PREOPERATIVE DIAGNOSES:  Personal history of carcinoma of breast and  acquired absence of breasts bilaterally.   POSTOPERATIVE DIAGNOSES:  Personal history of carcinoma of breast and  acquired absence of breasts bilaterally.   OPERATION:  Delayed breast reconstruction with retropectoral placement  of Mentor Siltex silicone textured tissue expanders, 250 mL of normal  saline placed bilaterally.   SURGEON:  Pleas Patricia, MD   ANESTHESIA:  General endotracheal.   FINDINGS:  The patient had had a previous bilateral mastectomy and was  doing well with no evidence of disease.  After much deliberation, she  decided to proceed with above surgical procedure.   PROCEDURE:  The patient was brought to the operating room, given a  general endotracheal anesthetic, prepped with Betadine and draped about  the chest wall in a sterile fashion.  She was marked off for similar  incisions to be made utilizing the previous transverse mastectomy  incision as well as the extent of the retropectoral space to be created.  The incisions were made and continued down until the pectoralis major  muscle was encountered.  The muscle was split in the direction of its  fibers and the retropectoral space entered.  With fiberoptic  illumination, the above retropectoral space was created.  Bleeding was  controlled with electrocautery and there was noted to be a good  hemostasis.  The above Mentor tissue expanders were placed, 250 mL of  normal saline having been added.  At this point, the muscle was closed  with interrupted #3-0 Vicryl and the skin with  interrupted subcutaneous  #4-0 Monocryl followed by running subcuticular #4-0 Monocryl.  Steri-  Strips, 4 x 8s, ABD and circumthoracic Ace bandage were applied.  The  patient tolerated the procedure well.  She was able to be discharged  from the operating room to the recovery room, subsequently to be  admitted to [facility] for overnight observation.      Consuello Bossier., M.D.  Electronically Signed     Consuello Bossier., M.D.  Electronically Signed   HH/MEDQ  D:  12/02/2008  T:  12/02/2008  Job:  [id number]

## 2011-02-08 NOTE — H&P (Signed)
[name], URBAS NO.:  [id number]   MEDICAL RECORD NO.:  [id number]          PATIENT TYPE:  INP   LOCATION:  6706                         FACILITY:  [facility]   PHYSICIAN:  Gaspar Garbe, M.D.DATE OF BIRTH:  11/01/1945   DATE OF ADMISSION:  04/24/2009  DATE OF DISCHARGE:  04/25/2009                              HISTORY & PHYSICAL   CHIEF COMPLAINT:  Chest pain.   HISTORY OF PRESENT ILLNESS:  The patient is a 65 year old white female  who has had breast reconstruction 9 days ago per Dr. Stephens November.  She  indicates that she had an episode of very sharp chest pain which  radiates to her back.  With her history of aortic aneurysm and with her  cardiac risk factors, she was very concerned about this and came to the  emergency room where she underwent a CT of her chest, not showing any  advancement in her cardiac enzymes or in her known 4-cm aortic aneurysm.  She required admission for observation.  She is, otherwise, chest pain  free currently and eating breakfast without difficulty.   ALLERGIES:  CECLOR and SULFA.   MEDICATIONS:  1. Lotrel 5/10 once daily.  2. Tamoxifen 20 mg b.i.d.  3. Xanax 0.5 mg b.i.d. p.r.n.  4. Vitamin C 500 mg p.o. daily.  5. Vitamin D 400 International Units once daily.  6. Calcium 1 tablet daily.   PAST MEDICAL HISTORY:  1. Breast cancer with history of recent reconstruction.  2. A 4-cm aortic aneurysm, followed by Thoracic Surgery with recent      followup showing stability over the past 4 years.  3. Hiatal hernia with fundoplication.  4. History of kidney stones.  5. Bilateral breast reconstructions performed on December 02, 2008, and      April 16, 2009, per Dr. Stephens November.  6. Fundoplication in 2002.  7. Bilateral mastectomy.   SOCIAL HISTORY:  The patient lives in [place] with her husband.  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  She  is married with 1 child.  Nonsmoker, nondrinker.   Family history includes breast cancer and ovarian cancer.   REVIEW OF SYSTEMS:  The patient complained of some chest pain earlier  and nausea.  Her 10-point review of systems is, otherwise, currently  negative.   PHYSICAL EXAMINATION:  VITAL SIGNS:  Temperature 98.2, pulse 57,  respiratory rate 16, blood pressure 113/73, oxygen sats 96% on room air.  GENERAL:  No acute distress.  HEENT: Normocephalic, atraumatic.  PERRLA.  EOMI.  ENT is normal limits.  NECK:  Supple.  No lymphadenopathy, JVD, or bruit.  HEART:  Regular rate and rhythm.  No murmur, rub, or gallop.  LUNGS:  Clear to auscultation bilaterally.  SKIN:  The patient has bandages on areas for recent breast tissue  expansion per Dr. Stephens November.  ABDOMEN:  Soft, nontender, normoactive bowel sounds.  No  hepatosplenomegaly appreciated.  EXTREMITIES:  No clubbing, cyanosis, or edema.  NEURO:  Oriented to person, place, and time without deficits.   Chest x-ray, nonacute.  CT angio showed no evidence of PE or further  enlargement of her aortic aneurysm and no evidence of dissection.   LABORATORY DATA:  White count 3.5, hemoglobin 12.5, hematocrit 37.2,  platelets 205, BUN and creatinine 13 and 0.6 respectively, glucose 109,  myoglobin 23.1.  CK less than 1, troponin less than 0.05.   ASSESSMENT AND PLAN:  1. Chest pain, likely noncardiac and most likely related to her recent      surgery.  Risk factors include hypertension and her aneurysm for      which expansion has been ruled out.  We will rule her out for      enzymes by later today and most likely send her home today if her      cardiac workup is negative.  She has had today EKGs which have been      negative as well.  Question as to whether this is reflux related      although not likely so given her symptoms.  It is most likely      related to her recent surgery and pain secondary to remodeling.      She was reassured by this and needs to follow up with her plastic       surgeon, followup is on Monday.  She intends to go back to work on      Monday as well.  2. Hypertension, currently controlled on Lotrel.  3. Breast cancer, currently on tamoxifen.  4. Abdominal aortic aneurysm as noted above.  5. The patient states Xanax for anxiety.  We will continue in      hospital.      Gaspar Garbe, M.D.  Electronically Signed     RWT/MEDQ  D:  04/25/2009  T:  04/26/2009  Job:  [id number]

## 2011-02-08 NOTE — Op Note (Signed)
[name], [name]                 ACCOUNT NO.:  [id number]   MEDICAL RECORD NO.:  [id number]          PATIENT TYPE:  OIB   LOCATION:  5152                         FACILITY:  [facility]   PHYSICIAN:  Angelia Mould. Derrell Lolling, M.D.DATE OF BIRTH:  1946/05/31   DATE OF PROCEDURE:  DATE OF DISCHARGE:                               OPERATIVE REPORT   PREOPERATIVE DIAGNOSIS:  Invasive ductal carcinoma, left breast.   POSTOPERATIVE DIAGNOSIS:  Invasive ductal carcinoma, left breast.   OPERATION PERFORMED:  1. Injection of blue dye, left breast.  2. Left axillary sentinel lymph node mapping and biopsy.  3. Bilateral total mastectomy.   SURGEON:  Angelia Mould. Derrell Lolling, MD   FIRST ASSISTANT:  Currie Paris, MD   OPERATIVE INDICATIONS:  This is a 65 year old white female who has had  multiple breast biopsies in the past for benign disease.  She recently  had screening mammograms and was found to have a 9-mm irregular mass in  the left breast medial and posterior.  Stereotactic core biopsy was  performed and this revealed invasive ductal carcinoma.  Her verbal  report is that this is receptor positive and HER-2 negative.  She was  counseled as an outpatient.  She has basically decided to have bilateral  mastectomies and was not interested in lumpectomy or breast  conservation.  She was counseled regarding this.  She was brought to the  operating room electively.   OPERATIVE TECHNIQUE:  Following induction of general endotracheal  anesthesia, the patient was identified as the correct patient, correct  procedure, and correct site.  Intravenous antibiotics were given.  After  prepping the left periareolar area, I injected 5 mL of blue dye in the  left subareolar area.  This was 2 mL of methylene blue mixed with 3 mL  of saline.  The breast was massaged for 4 minutes.  We then prepped the  neck, breasts, chest, lateral chest wall, axillas, and upper arms in a  sterile fashion.  It was draped out in the  usual fashion.   We used a marking pen to mark the inframammary crease in the midline.  I  marked symmetrical transverse elliptical incisions with the intent of  having a minimal skin redundancy.  Using the NeoProbe, I could identify  the radioactivity in the periareolar area and the radioactivity in the  axilla.   The left mastectomy was performed first.  On the left side, a transverse  elliptical incision was made.  Skin flaps were raised superiorly to just  below the clavicle, medially to just the parasternal area, inferiorly to  the anterior rectus sheath, and laterally to the anterior border of the  latissimus dorsi muscle.  I used the NeoProbe and traced out with a  single very hot, very blue lymph node in level I lymph nodes.  There was  another lymph node medially adjacent to this which I took out as well,  but it was not hot and not blue so I sent it for routine histology as a  nonsentinel node.  Dr. Charlott Rakes in pathology performed imprint  cytology  on the sentinel node and said that he did not identify any  cancerous cells.  I dissected the left breast off the pectoralis major  and pectoralis minor muscles by using electrocautery.  Laterally, a  couple of vascular channels were controlled with metal clips and  divided.  We made sure that we took the tail of Spence with [place].  The  specimen was marked with silk suture to mark the upper outer quadrant  and sent for routine histology.  Hemostasis was excellent and achieved  with electrocautery.  The wound was copiously irrigated with saline.  The wound was packed with laparotomy pads.   On the right side, we performed a prophylactic total mastectomy.  A  mirror image transverse elliptical incision was made.  Skin flaps were  raised superiorly, medially, inferiorly, and laterally in similar  fashion.  We dissected the breast, the pectoralis fascia away from the  underlying pectoralis major and minor muscles and included the tail  of  Spence.  We marked this breast specimen in the upper outer quadrant with  silk sutures well and sent it for routine histology.  This wound was  irrigated with saline.  Hemostasis was excellent and achieved with  electrocautery.   We examined both wounds, irrigated one more time, made sure that we were  completely hemostatic.  On each side. we placed two 19-French Blake  drains, one up into the axilla and one across the skin flaps.  These  were brought out through separate stab incisions inferolaterally and  each was sutured to the skin with a nylon suture.  Skin incisions were  closed with skin staples.  The wounds looked good.  The wounds were  cleansed, dried, covered with Adaptic gauze, 4 x 4, ABD, and 6-inch Ace  wrap.  The patient tolerated the procedure well and was taken to the  recovery room in stable condition.   ESTIMATED BLOOD LOSS:  About 200 mL.   COMPLICATIONS:  None.   Sponge, needle, and instrument counts were correct.      Angelia Mould. Derrell Lolling, M.D.  Electronically Signed     HMI/MEDQ  D:  04/04/2008  T:  04/05/2008  Job:  [id number]   cc:   Lenon Curt. Chilton Si, M.D.  Consuello Bossier., M.D.

## 2011-02-11 ENCOUNTER — Ambulatory Visit
Admission: RE | Admit: 2011-02-11 | Discharge: 2011-02-11 | Disposition: A | Discharge status: Home / Self Care | Payer: 59 | Source: Ambulatory Visit | Attending: Oncology | Admitting: Oncology

## 2011-02-11 DIAGNOSIS — Z79899 Other long term (current) drug therapy: Secondary | ICD-10-CM

## 2011-02-11 DIAGNOSIS — Z853 Personal history of malignant neoplasm of breast: Secondary | ICD-10-CM

## 2011-02-11 NOTE — Op Note (Signed)
[name], [name]                 ACCOUNT NO.:  [id number]   MEDICAL RECORD NO.:  [id number]          PATIENT TYPE:  AMB   LOCATION:  ENDO                         FACILITY:  [facility]   PHYSICIAN:  James L. Malon Kindle., M.D.DATE OF BIRTH:  1946/01/30   DATE OF PROCEDURE:  12/10/2004  DATE OF DISCHARGE:                                 OPERATIVE REPORT   PROCEDURE:  Colonoscopy.   MEDICATIONS:  Fentanyl 87.5 mcg, Versed 7 mg IV.   SCOPE:  Olympus pediatric adjustable colonoscope.   INDICATIONS:  Strong family history of colon cancer.  This is done as a five-  year follow-up procedure.   DESCRIPTION OF PROCEDURE:  The procedure has been explained to the patient  and consent obtained.  With the patient in the left lateral decubitus  position, the Olympus scope was inserted and advanced.  The prep was  excellent.  We were able to reach the cecum using minimal pressure.  The  scope withdrawn, and the cecum, ascending colon, transverse colon, splenic  flexure, descending and sigmoid colon were seen well.  No polyps are seen  throughout.  There were scattered diverticula throughout on the right as  well as the left colon.  These were really very minimal.  The scope was  withdrawn down into the rectum.  The rectum was free of polyps.  The scope  was withdrawal.  The patient tolerated the procedure well.   ASSESSMENT:  A family history of colon cancer with negative colonoscopy at  this time.  V16.0.   PLAN:  Will recommend yearly Hemoccults and possibly a repeat procedure in  five years.      [name]  D:  12/10/2004  T:  12/10/2004  Job:  [id number]   cc:   Lenon Curt. Chilton Si, M.D.  [address]  [place]  [place] [address]  Fax: [phone]

## 2011-02-11 NOTE — Discharge Summary (Signed)
[name], FLIGHT                 ACCOUNT NO.:  [id number]   MEDICAL RECORD NO.:  [id number]          PATIENT TYPE:  INP   LOCATION:  6706                         FACILITY:  [facility]   PHYSICIAN:  Gaspar Garbe, M.D.DATE OF BIRTH:  06-04-1946   DATE OF ADMISSION:  04/24/2009  DATE OF DISCHARGE:  04/25/2009                               DISCHARGE SUMMARY   FINAL DIAGNOSES:  1. Noncardiac chest pain, ruled out by enzymes.  2. Hypertension.  3. History of breast cancer.  4. Hiatal hernia with history of fundoplication.  5. History of kidney stones.  6. Bilateral mastectomy.   LABORATORY TESTING:  The patient had negative cardiac enzymes and  negative EKG during the course of her stay.  BUN and creatinine, 13 and  0.6 respectively.  Myoglobin 23.1.  CK-MB less 1 and troponin less than  0.05.  Chest x-ray showed no evidence of PE or dissection.   HOSPITAL COURSE:  The patient was admitted for observation for chest  pain with risk factors of hypertension and AAA.  She was ruled out  effectively by enzymes and arrange for followup with Dr. Evlyn Kanner as an  outpatient, who has seen the patient in the past, it is most likely  gastroesophageal reflux disease related pain.  The pain gone by the time  she was seen on the floor and subsequently discharged without incident.  The patient was continued on her Lotrel as this is her usual medicine as  well as tamoxifen 20 mg twice daily and told to get an over-the-counter  PPI being omeprazole or Prevacid, and to take on a daily basis until she  was seen in followup.   MEDICATIONS ON DISCHARGE:  1. Lotrel 5/10 once daily.  2. Omeprazole 20 mg once daily over-the-counter.  3. Tamoxifen 20 mg b.i.d.  4. Xanax 0.5 mg b.i.d. p.r.n. for anxiety.  5. Vitamin C 500 mg once daily.  6. Vitamin D 400 international units once daily.  7. Calcium 1 tablet daily.   CONDITION ON DISCHARGE:  Stable.   PHYSICAL EXAMINATION ON DATE OF DISCHARGE:  VITAL  SIGNS:  Temperature  98.2, pulse 57, respiratory rate 16, and blood pressure 113/73.  GENERAL:  In no acute distress.  HEENT:  Normocephalic and atraumatic.  PERRLA.  EOMI.  ENT is within  normal limits.  NECK:  Supple.  No lymphadenopathy, JVD, or bruit.  HEART:  Regular rate and rhythm.  No murmur, rub, or gallop.  EXTREMITIES:  No clubbing, cyanosis, or edema.      Gaspar Garbe, M.D.  Electronically Signed     RWT/MEDQ  D:  05/12/2009  T:  05/12/2009  Job:  [id number]

## 2011-02-11 NOTE — Op Note (Signed)
[name], [name]                           ACCOUNT NO.:  [id number]   MEDICAL RECORD NO.:  [id number]                   PATIENT TYPE:  AMB   LOCATION:  ENDO                                 FACILITY:  [facility]   PHYSICIAN:  James L. Malon Kindle., M.D.          DATE OF BIRTH:  05-07-1946   DATE OF PROCEDURE:  DATE OF DISCHARGE:                                 OPERATIVE REPORT   PROCEDURE:  Esophagogastroduodenoscopy.   GASTROENTEROLOGIST:  Llana Aliment. Randa Evens, M.D.   MEDICATIONS:  Cetacaine spray.  Fentanyl 40 mg, Versed 4 mg IV.   INDICATIONS FOR PROCEDURE:  The patient had a laparoscopic Nissen  fundoplication for severe reflux about two years ago and did very well for  about a year and one-half.  For the past six months, she is beginning to  have vague weight loss with some epigastric pain that has been fairly  minimal.  CT scan was nonrevealing.  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  We started her back on the PPI and went ahead with  endoscopy to look at this area.   DESCRIPTION OF PROCEDURE:  The procedure had been explained to the patient  and consent obtained.  With the patient in the left lateral decubitus  position, the Olympus scope was inserted under direct visualization.  Stomach entered.  Pylorus identified and passed.  Duodenum including the  bulb and second portion was seen well and was unremarkable.  The scope was  withdrawn back into the stomach.  The antrum, pyloric channel, and body were  all normal.  The fundus and cardia were seen well in the retroflexed view  and appeared normal.  The wrap was completely intact.  There was no evidence  of any breakdown.  The scope was withdrawn, and the wrap appeared to be  intact from the esophagus side as well.  There was a good line of  demarcation.  Only a small amount of the stomach was in the chest.  No  ulceration or inflammation.  The distal and proximal esophagus were seen  well upon withdrawal  and were normal.   The scope was withdrawn,  The patient tolerated the procedure well and was  maintained on low-flow oxygen and pulse oximetry throughout the procedure.   ASSESSMENT:  Epigastric pain with the wrap completely intact.  No obvious  cause on endoscopy.   PLAN:  Will continue on PPI for now and will see her back in the office in  four to six weeks.                                               James L. Malon Kindle., M.D.    Waldron Session  D:  04/25/2002  T:  04/30/2002  Job:  [id number]   cc:   Lenon Curt. Chilton Si, M.D.   Angelia Mould. Derrell Lolling, M.D.  Fax: [phone]

## 2011-02-11 NOTE — Procedures (Signed)
[facility]  Patient:    [name], [name]                        MRN: [id number] Proc. Date: 08/14/00 Adm. Date:  [id number] Disc. Date: [id number] Attending:  Orland Mustard CC:         Lenon Curt. Chilton Si, MD  Angelia Mould. Derrell Lolling, M.D.   Procedure Report  PROCEDURE:  Esophageal manometry.  INDICATION:  The procedure is done to facilitate placement of a 24-hour pH probe and in anticipation of possible antireflux operation.  INDICATION:  Severe esophageal reflux symptoms refractory to medical therapy.  DESCRIPTION OF PROCEDURE:  The procedure was performed in the [facility] Manometry Lab in the usual fashion.  The results were as follows:  1. Upper esophageal sphincter relaxed completely with swallowing.  Pharyngeal    spikes were present. 2. Esophageal body mean pressure 158 mm, mean duration 4.9 seconds.    Peristalsis was normal in all ten wet swallows. 3. Lower esophageal sphincter, mean pressure 37 mm, relaxed completely with    swallowing.  Residual pressure of swallowing was 4.7 mm within the normal    range, with 86% relaxation.  ASSESSMENT:  Essentially normal esophageal manometry.  PLAN:  Will proceed at this time with 24-hour pH probe. DD:  08/14/00 TD:  08/15/00 Job: [id number]

## 2011-02-11 NOTE — Discharge Summary (Signed)
[facility]  Patient:    [name], [name]                        MRN: [id number] Adm. Date:  [id number] Disc. Date: [id number] Attending:  Brandy Hale CC:         Llana Aliment. Randa Evens, M.D.   Discharge Summary  DISCHARGE DIAGNOSES: 1. Gastroesophageal reflux disease. 2. Benign cavernous hemangioma of the liver. 3. Right flank pain of uncertain etiology. 4. Status post laparoscopic cholecystectomy.  OPERATION PERFORMED:  Laparoscopic Nissen fundoplication.  HISTORY OF PRESENT ILLNESS:  This is a 65 year old white female who has had problems with postprandial and epigastric discomfort for some time.  She has some chest discomfort.  She has tried proton inhibitors which have not helped. A CT scan showed a small hemangioma of the right lobe of the liver which has been stable for many years.  She has had a cholecystectomy in the past.  She had an endoscopy last year which showed a widely patent gastroesophageal junction and some inflammation of the distal esophagus.  She has had a 24 hours pH probe which shows a significant increased Demeester score.  She has had a manometry which is essentially normal.  Dr. Carman Ching felt that she had significant reflux, and because failure to respond to therapy she was sent to me for consideration of anti-reflux surgery.  I felt that this was a reasonable option.  She is brought to the hospital electively.  For details of her past history, social history and family history, please see the detailed admission note.  PHYSICAL EXAMINATION:  GENERAL:  Pleasant, middle-age woman.  NECK:  No mass.  LUNGS:  Clear to auscultation.  HEART:  Regular rate and rhythm, no murmur.  ABDOMEN:  Soft, nontender, not distended.  Trocar site is well-healed.  EXTREMITIES:  No edema.  Good pulses.  HOSPITAL COURSE:  On the day of admission, the patient was taken to the operating room and underwent an uneventful Nissen  fundoplication.  On the day following surgery, she was started on a clear liquid diet but advanced that slowly but uneventfully.  On the second postoperative day, she was found to have a potassium of 3.0, and we elected to treat that.  On October 09, 2000, she was feeling well and wanted to go home.  She was following a full liquid diet, ambulating uneventfully, had normal blood pressure and vital signs, unremarkable abdominal exam, and her potassium was 3.7.  DISCHARGE MEDICATIONS:  At discharge she was given a prescription for Darvocet-N 100 mg for pain and multivitamins with iron and told to continue her usual medications.  DISCHARGE INSTRUCTIONS:  Diet was discussed.  She was asked to return to see me within one week. DD:  10/26/00 TD:  10/26/00 Job: [id number]

## 2011-02-11 NOTE — Op Note (Signed)
[facility]  Patient:    [name], [name]                        MRN: [id number] Adm. Date:  [id number] Attending:  Ellwood Handler                           Operative Report  DATE OF BIRTH:  November 15, 1945.  PREOPERATIVE DIAGNOSIS:  Faintly calcified, partially obstructive 6 mm left ureteropelvic junction stone.  POSTOPERATIVE DIAGNOSIS:  Faintly calcified, partially obstructive 6 mm left ureteropelvic junction stone.  PROCEDURES:  Cystoscopy, retrograde, left double J stent placement.  ANESTHESIA:  General.  DRAINS:  A 6 French, 24 cm left double J stent.  DESCRIPTION OF PROCEDURE:  Patient was prepped and draped in the dorsal lithotomy position after institution of an adequate level of general anesthesia.  A well-lubricated 21 French panendoscope was gently inserted at the urethral meatus.  Normal urethra and sphincter.  Clear efflux, right orifice.  Minimal efflux, left orifice.  Right retrograde showed normal course and caliber of the ureter, pelvis, and calyces, with prompt drainage of three to five minutes.  Left retrograde showed normal course and caliber of the ureter with a 6 mm filling defect in the region just beneath the UPJ.  With gentle injection of contrast, filling defect was seen to be dislocated back into the renal pelvis.  A 6 French, 24 cm left double J stent was then advanced over the indwelling guidewire with excellent pigtail formation on guidewire removal.  Bladder was drained.  Cystoscope was removed.  Patient was returned to recovery in satisfactory condition. DD:  12/05/00 TD:  12/06/00 Job: [id number]

## 2011-02-11 NOTE — Procedures (Signed)
[name]  Patient:    [name], [name]                        MRN: [id number] Proc. Date: 08/07/00 Adm. Date:  [id number] Disc. Date: [id number] Attending:  Orland Mustard CC:         Lenon Curt. Chilton Si, MD  Angelia Mould. Derrell Lolling, M.D.   Procedure Report  PROCEDURE:  A 24 hour pH monitor.  HISTORY:  The patient has had symptoms very suggestive of esophageal reflux that has been refractory to medical therapy.  She has had epigastric pain, coughing etc.  She has a CT scan which was unremarkable.  The pain is unchanged with eating although it is somewhat worse now.  She is post cholecystectomy.  She has had an endoscopy which showed a widely patent GE junction and in view of this the feeling was that she have a 24 hour pH probe to further document and clarify the amount of reflux.  The probe was placed following menometry. The results are as follows:   I. RESULTS:  Probe was in place a total of 24 hours.     1. Distal probe upright position 78 episodes of reflux 3.6% of the time all       being less than 6.3.  This was in the normal range.    2. 30 episodes of reflux during 11 hours at 14 minutes in the supine       position.  There was a total of 40 minutes with severe reflux in the       supine position, one episode lasted 33 minutes.  There was episodes       that lasted over 5 minutes.  In short the patient had a total of 40       minutes of reflux 6% of the time, well outside of the normal range       with less than 1%.    3. Total 108 episodes of reflux the longest lasted 33 minutes, 4.7% of the       time, outside of the normal range.   II. PROXIMAL PROBE:  Marked esophageal reflux in the proximal probe with 32      episodes of reflux, 0.8% of the time but in the supine position there was      5.8% at the time of reflux with 41 episodes of reflux, one lasting 36      minutes, significantly outside of the normal range.  III. The patients  symptoms complex indicated in the supine position, she had      much more symptoms with reflux lasting approximately lasting 40% of the      time while supine.  ASSESSMENT:  Composite score for the lower probe was 38 with the normal being less than 22, this is nearly double the high limits of normal, indicating significant esophageal reflux.  PLAN:  Will go ahead and discuss this with her and would suggest that she see Dr. Derrell Lolling, to discuss the entire reflux procedure. DD:  08/14/00 TD:  08/15/00 Job: [id number]

## 2011-02-11 NOTE — Op Note (Signed)
[facility]  Patient:    [name], [name]                        MRN: [id number] Proc. Date: 10/06/00 Adm. Date:  [id number] Attending:  Brandy Hale CC:         Llana Aliment. Randa Evens, M.D.  Lenon Curt Chilton Si, MD   Operative Report  PREOPERATIVE DIAGNOSIS:  Gastroesophageal reflux disease.  POSTOPERATIVE DIAGNOSIS:  Gastroesophageal reflux disease.  OPERATION PERFORMED:  Laparoscopic Nissen fundoplication.  SURGEON:  Dr. Claud Kelp.  FIRST ASSISTANT:  Dr. Luretha Murphy.  INDICATIONS FOR PROCEDURE:  This is a 65 year old white female with long standing documented reflux esophagitis. She has heartburn. She also has some right upper quadrant pain and right flank pain which is poorly described. 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 She has had her gallbladder out a few years ago. Endoscopy shows a widely patent GE junction and esophagitis. A 24 hour pH probe recently showed significant increased episodes of acid reflux. Manometry is essentially normal. Because of documented esophagitis, documented acid reflux and failure of maximal medical therapy to control her symptoms, she was considered for antireflux surgery. She is brought to the operating room electively.  OPERATIVE FINDINGS:  The anatomy of the diaphragmatic crura, the esophagus, stomach, duodenum, spleen, and liver were normal. The gallbladder was surgically absent. There were no adhesions in the abdomen or pelvis that I could detect. Specifically no adhesions of the liver to the diaphragm and no adhesions in the subhepatic space and no adhesions in the right pericolic gutter to explain her right flank pain. I could not visualize the hemangioma of the liver.  TECHNIQUE: Following the induction of general endotracheal anesthesia, the patients abdomen was prepped and draped in a sterile fashion.  The patient was in a modified dorsal lithotomy position. Then 0.5% Marcaine with epinephrine was used as a local infiltration anesthetic. A vertical incision was made just above the umbilicus. The fascia was incised in the midline, the abdominal cavity entered under direct vision. A 10 mm Hasson trocar was inserted and secured with a pursestring suture of #0 Vicryl. Pneumoperitoneum was created. A video camera was inserted with visualization and findings as described above. I put two 10 mm trocars in the left subcostal region and a 10 and a 12 mm trocar in the right subcostal region. A fan retractor was I inserted in the right lateral trocar site and this was used to elevate the left lobe of the liver. Exposure of the esophageal hiatus was excellent. Using the harmonic scalpel, we took down the gastrohepatic omentum and exposed the right crus of the diaphragm. Using the harmonic scalpel, we took down the areolar tissue anterior to the esophagus freeing the esophagus from the apex of the crura. We dissected out both the right and left crus. We had good exposure and fairly easily created a nice window behind the esophagus. We dissected the fundus of the stomach completely off of the left crus. Short gastric vessels were completely taken down with the harmonic scalpel and we had excellent free mobility of the fundus at that point. We inserted a 50 French lighted dilator down beside the 18 French nasogastric tube. We then closed the crura with a single stitch of #0 Surgitek. We then found a suitable piece of fundus and brought it behind the esophagus and found  another suitable piece of fundus on the left side and positioned the patient for a fundoplication. We were able to do a very loose fundoplication without any tension whatsoever. The fundoplication was created with three sutures of #0 Surgitek. Each suture took a generous bite of the left fundus, a small bite of the anterior wall of  the esophagus, and a generous bite of the fundus of the right side. Great care was taken to avoid the anterior vagus nerve. These sutures were then individually tied and then the dilator was removed. We tacked the fundus to the crura to prevent slippage. Two such sutures were placed, one on the right and one on the left and this effectively secured the fundoplication to the crura anteriorly. The nasogastric tube and dilator had been removed. We examined the operative field carefully. There was on bleeding from the spleen or from the hiatus or from the liver. We removed all of our trocars and there was no bleeding from any trocar site. We checked the dome of the liver and the undersurface of the liver and could not visualize any abnormality of the liver.  After we were satisfied, we took photographs of the fundoplication. The pneumoperitoneum was released. The fascia of the umbilicus was closed with #0 Vicryl sutures. The skin incisions were closed with Steri-Strips and skin staples. Clean bandages were placed and the patient taken to the recovery room in stable condition. Estimated blood loss was about 25-30 cc. Complications none.  Sponge, needle and instrument counts were correct. DD:  10/06/00 TD:  10/07/00 Job: [id number]

## 2011-02-23 ENCOUNTER — Other Ambulatory Visit: Payer: Self-pay | Admitting: Oncology

## 2011-02-23 ENCOUNTER — Encounter (HOSPITAL_BASED_OUTPATIENT_CLINIC_OR_DEPARTMENT_OTHER): Payer: 59 | Admitting: Oncology

## 2011-02-23 DIAGNOSIS — Z170 Estrogen receptor positive status [ER+]: Secondary | ICD-10-CM

## 2011-02-23 DIAGNOSIS — C50419 Malignant neoplasm of upper-outer quadrant of unspecified female breast: Secondary | ICD-10-CM

## 2011-02-23 LAB — CBC WITH DIFFERENTIAL/PLATELET
Basophils Absolute: 0 10*3/uL (ref 0.0–0.1)
Eosinophils Absolute: 0 10*3/uL (ref 0.0–0.5)
HGB: 13.4 g/dL (ref 11.6–15.9)
LYMPH%: 49.9 % — ABNORMAL HIGH (ref 14.0–49.7)
MCV: 95.6 fL (ref 79.5–101.0)
MONO#: 0.4 10*3/uL (ref 0.1–0.9)
MONO%: 7.6 % (ref 0.0–14.0)
NEUT#: 2.3 10*3/uL (ref 1.5–6.5)
Platelets: 233 10*3/uL (ref 145–400)
RBC: 4.18 10*6/uL (ref 3.70–5.45)
WBC: 5.5 10*3/uL (ref 3.9–10.3)

## 2011-02-23 LAB — COMPREHENSIVE METABOLIC PANEL
Albumin: 4.4 g/dL (ref 3.5–5.2)
Alkaline Phosphatase: 65 U/L (ref 39–117)
BUN: 21 mg/dL (ref 6–23)
CO2: 23 mEq/L (ref 19–32)
Glucose, Bld: 92 mg/dL (ref 70–99)
Potassium: 4.1 mEq/L (ref 3.5–5.3)
Sodium: 140 mEq/L (ref 135–145)
Total Protein: 7.5 g/dL (ref 6.0–8.3)

## 2011-02-23 LAB — VITAMIN D 25 HYDROXY (VIT D DEFICIENCY, FRACTURES): Vit D, 25-Hydroxy: 46 ng/mL (ref 30–89)

## 2011-02-23 LAB — CANCER ANTIGEN 27.29: CA 27.29: 13 U/mL (ref 0–39)

## 2011-02-28 ENCOUNTER — Encounter (HOSPITAL_BASED_OUTPATIENT_CLINIC_OR_DEPARTMENT_OTHER): Payer: 59 | Admitting: Oncology

## 2011-02-28 DIAGNOSIS — Z170 Estrogen receptor positive status [ER+]: Secondary | ICD-10-CM

## 2011-02-28 DIAGNOSIS — C50219 Malignant neoplasm of upper-inner quadrant of unspecified female breast: Secondary | ICD-10-CM

## 2011-03-21 ENCOUNTER — Encounter (INDEPENDENT_AMBULATORY_CARE_PROVIDER_SITE_OTHER): Payer: Self-pay | Admitting: General Surgery

## 2011-03-21 ENCOUNTER — Ambulatory Visit (INDEPENDENT_AMBULATORY_CARE_PROVIDER_SITE_OTHER): Payer: 59 | Admitting: General Surgery

## 2011-03-21 VITALS — BP 126/82 | HR 76 | Temp 97.0°F | Ht 62.5 in | Wt 138.0 lb

## 2011-03-21 DIAGNOSIS — D1739 Benign lipomatous neoplasm of skin and subcutaneous tissue of other sites: Secondary | ICD-10-CM

## 2011-03-21 DIAGNOSIS — D1720 Benign lipomatous neoplasm of skin and subcutaneous tissue of unspecified limb: Secondary | ICD-10-CM

## 2011-03-21 NOTE — Patient Instructions (Signed)
We will schedule the surgery to remove the lipoma from the right shoulder in the near future.

## 2011-03-21 NOTE — Progress Notes (Signed)
Subjective:     Patient ID: [name], female   DOB: 1945/12/18, 65 y.o.   MRN: [id number]    BP 126/82  Pulse 76  Temp 97 F (36.1 C)  Ht 5' 2.5" (1.588 m)  Wt 138 lb (62.596 kg)  BMI 24.84 kg/m2    HPI [name] returns to see me because she feels a soft tissue mass on her right posterior shoulder that she was to have excised. She states  Dr.  Lonni Fix excised a lipoma from her left shoulder many years ago. She doesn't have any pain, and this has never been inflamed or drained anything. She states that she went to [facility] to see Neysa Hotter about breast reconstruction. She states that they're planning to remove and replace her implants and perform liposuction on her abdomen to fill in the gaps particularly in the tissue laterally.  Review of Systems  Constitutional: Negative.   HENT: Negative.   Respiratory: Negative.   Cardiovascular: Negative.   Gastrointestinal: Negative.   Genitourinary: Negative.   Musculoskeletal: Negative.        Objective:   Physical Exam  Neck: Neck supple. No JVD present. No tracheal deviation present. No thyromegaly present.  Cardiovascular: Normal rate, regular rhythm and normal heart sounds.   No murmur heard. Pulmonary/Chest: She has no wheezes. She has no rales. She exhibits no tenderness.  Musculoskeletal:       Arms: Lymphadenopathy:    She has no cervical adenopathy.       Assessment:     3 cm lipoma right posterior shoulder. History bilateral mastectomies for breast cancer, no evidence of recurrence.    Plan:       We talked about whether or not to remove this lipoma. She is aware that this is elective, but because of her history of breast cancer she is very nervous about it's presence. We decided to go ahead and excise this under monitored sedation in the OR.  I discussed the indications and details of the surgery with her. Risks and complications were outlined to her including, but not limited to bleeding,  infection, recurrence of a lipoma, nerve damage chronic pain or numbness. She understands all of these issues well. At this time all of her questions are answered. We will schedule the surgery at her convenience in the near future.

## 2011-04-12 ENCOUNTER — Encounter (HOSPITAL_BASED_OUTPATIENT_CLINIC_OR_DEPARTMENT_OTHER)
Admission: RE | Admit: 2011-04-12 | Discharge: 2011-04-12 | Disposition: A | Discharge status: Home / Self Care | Payer: 59 | Source: Ambulatory Visit | Attending: General Surgery | Admitting: General Surgery

## 2011-04-12 LAB — BASIC METABOLIC PANEL
CO2: 24 mEq/L (ref 19–32)
Calcium: 9.4 mg/dL (ref 8.4–10.5)
Creatinine, Ser: 0.61 mg/dL (ref 0.50–1.10)
GFR calc Af Amer: >60 mL/min (ref >60)
GFR calc non Af Amer: >60 mL/min (ref >60)
Sodium: 141 mEq/L (ref 135–145)

## 2011-04-13 ENCOUNTER — Other Ambulatory Visit: Payer: Self-pay | Admitting: Thoracic Surgery (Cardiothoracic Vascular Surgery)

## 2011-04-13 DIAGNOSIS — I712 Thoracic aortic aneurysm, without rupture: Secondary | ICD-10-CM

## 2011-04-15 ENCOUNTER — Other Ambulatory Visit (INDEPENDENT_AMBULATORY_CARE_PROVIDER_SITE_OTHER): Payer: Self-pay | Admitting: General Surgery

## 2011-04-15 ENCOUNTER — Ambulatory Visit (HOSPITAL_BASED_OUTPATIENT_CLINIC_OR_DEPARTMENT_OTHER)
Admission: RE | Admit: 2011-04-15 | Discharge: 2011-04-15 | Disposition: A | Discharge status: Home / Self Care | Payer: 59 | Source: Ambulatory Visit | Attending: General Surgery | Admitting: General Surgery

## 2011-04-15 DIAGNOSIS — I714 Abdominal aortic aneurysm, without rupture, unspecified: Secondary | ICD-10-CM | POA: Insufficient documentation

## 2011-04-15 DIAGNOSIS — Z9010 Acquired absence of unspecified breast and nipple: Secondary | ICD-10-CM | POA: Insufficient documentation

## 2011-04-15 DIAGNOSIS — I10 Essential (primary) hypertension: Secondary | ICD-10-CM | POA: Insufficient documentation

## 2011-04-15 DIAGNOSIS — Z01812 Encounter for preprocedural laboratory examination: Secondary | ICD-10-CM | POA: Insufficient documentation

## 2011-04-15 DIAGNOSIS — D1739 Benign lipomatous neoplasm of skin and subcutaneous tissue of other sites: Secondary | ICD-10-CM | POA: Insufficient documentation

## 2011-04-15 DIAGNOSIS — Z853 Personal history of malignant neoplasm of breast: Secondary | ICD-10-CM | POA: Insufficient documentation

## 2011-04-15 DIAGNOSIS — Z01810 Encounter for preprocedural cardiovascular examination: Secondary | ICD-10-CM | POA: Insufficient documentation

## 2011-04-15 LAB — POCT HEMOGLOBIN-HEMACUE: Hemoglobin: 12.5 g/dL (ref 12.0–15.0)

## 2011-04-18 NOTE — Op Note (Signed)
  NAMEBRANDICE, [name]:  [id number]  MEDICAL RECORD NO.:  [id number]  LOCATION:                                 FACILITY:  PHYSICIAN:  Angelia Mould. Derrell Lolling, M.D.     DATE OF BIRTH:  DATE OF PROCEDURE:  04/15/2011 DATE OF DISCHARGE:                              OPERATIVE REPORT   PREOPERATIVE DIAGNOSIS:  A 3-cm soft tissue mass right shoulder, suspect lipoma.  POSTOPERATIVE DIAGNOSIS:  A 3-cm soft tissue mass right shoulder, suspect lipoma.  OPERATION PERFORMED:  Excision 3-cm soft tissue mass right shoulder down to deep muscle fascia.  SURGEON:  Angelia Mould. Derrell Lolling, MD  OPERATIVE INDICATIONS:  This is a 65 year old Caucasian female who has a history of bilateral mastectomy for breast cancer.  She has felt a soft tissue mass on the right posterior shoulder that has been slowly enlarging.  On exam, there is a 3-cm multilobulated soft tissue mass in the right posterior shoulder overlying the scapula which feels like a lipoma.  There is no adenopathy.  There is no sign of any recurrence of her breast cancer.  Because of her history of breast cancer, she is anxious about the presence of this lipoma and its slow enlargement.  We have agreed to excise this area.  OPERATIVE TECHNIQUE:  The patient was brought to the operating room and placed in a left lateral decubitus position with appropriate padding. She was sedated and monitored by the Anesthesia Department.  The entire right shoulder area was prepped and draped in a sterile fashion. Surgical time-out was held identifying correct patient, correct procedure and correct site.  Xylocaine 1% with epinephrine was used as a local infiltration anesthetic.  An oblique incision was made in the skin lines overlying this mass.  The incision was probably at least 3- cm. Dissection was carried down through the dermis and through a very superficial layer of subcutaneous tissue.  I then entered a dissection plane of the  multilobulated lipoma.  I slowly dissected this out with electrocautery and blunt dissection and sharp dissection.  Ultimately, this had to go all the way down to the deep muscle fascia for a complete excision.  Once this was completely excised, the specimen was sent for routine histology.  The wound was irrigated with saline.  Hemostasis was excellent and achieved with electrocautery.  Subcutaneous tissue was closed with 5 interrupted sutures of 3-0 Vicryl, being careful to take a bite of the muscle fascia in each case to close down the dead space.  Skin was closed with a running subcuticular suture of 4-0 Monocryl and Dermabond.  Clean bandages were placed and a nice pack was placed and the patient taken to recovery room in stable condition. Estimated blood loss was about 5 mL.  Complication was none.  Sponge, needle and instrument counts were correct.     Angelia Mould. Derrell Lolling, M.D.     HMI/MEDQ  D:  04/15/2011  T:  04/15/2011  Job:  [id number]  cc:   Barry Dienes. Eloise Harman, M.D.  Electronically Signed by Claud Kelp M.D. on 04/18/2011 05:42:12 PM

## 2011-05-04 ENCOUNTER — Encounter (INDEPENDENT_AMBULATORY_CARE_PROVIDER_SITE_OTHER): Payer: 59 | Admitting: General Surgery

## 2011-05-09 ENCOUNTER — Encounter (INDEPENDENT_AMBULATORY_CARE_PROVIDER_SITE_OTHER): Payer: Self-pay | Admitting: General Surgery

## 2011-05-09 ENCOUNTER — Ambulatory Visit (INDEPENDENT_AMBULATORY_CARE_PROVIDER_SITE_OTHER): Payer: 59 | Admitting: General Surgery

## 2011-05-09 ENCOUNTER — Ambulatory Visit
Admission: RE | Admit: 2011-05-09 | Discharge: 2011-05-09 | Disposition: A | Discharge status: Home / Self Care | Payer: 59 | Source: Ambulatory Visit | Attending: Thoracic Surgery (Cardiothoracic Vascular Surgery) | Admitting: Thoracic Surgery (Cardiothoracic Vascular Surgery)

## 2011-05-09 ENCOUNTER — Encounter (INDEPENDENT_AMBULATORY_CARE_PROVIDER_SITE_OTHER): Payer: 59 | Admitting: Thoracic Surgery (Cardiothoracic Vascular Surgery)

## 2011-05-09 VITALS — BP 116/68 | HR 70 | Temp 96.8°F

## 2011-05-09 DIAGNOSIS — I712 Thoracic aortic aneurysm, without rupture, unspecified: Secondary | ICD-10-CM

## 2011-05-09 DIAGNOSIS — Z9889 Other specified postprocedural states: Secondary | ICD-10-CM

## 2011-05-09 MED ORDER — IOHEXOL 300 MG/ML  SOLN
80.0000 mL | Freq: Once | INTRAMUSCULAR | Status: AC | PRN
  Administered 2011-05-09: 80 mL via INTRAVENOUS

## 2011-05-09 NOTE — Patient Instructions (Signed)
Your shoulder wound has healed well. The final pathology report shows a benign lipoma. You may return to low impact exercise. I would wait 3-4 weeks before doing a high impact exercise.  As long as you are on antiestrogen therapy, I would like for you to continue routine followup with Dr. Darnelle Catalan. If Dr. Darnelle Catalan discharges you from his care, and I would be happy to see your an annual basis for your  physical exam and breast check. Otherwise see me as needed.

## 2011-05-09 NOTE — Progress Notes (Signed)
Subjective:     Patient ID: [name], female   DOB: 07/14/1946, 65 y.o.   MRN: [id number]  HPI Patient underwent excision of soft tissue mass of the right posterior shoulder approximately 3 weeks ago. The final pathology report shows a benign lipoma. She states the wound is healing fine.  We talked about her breast cancer followup. She has gone back on a new antiestrogen therapy and she's going to try to do that for another 2 years. She's going to be following with Dr. Darnelle Catalan every 6 months because of that.  She has an appointment with Dr. Renae Gloss at [facility] on September 11 talked about revision of her reconstruction. She also plans to follow up Dr. Jamesetta Orleans because of that a lung nodule and aortic aneurysm.  Review of Systems     Objective:   Physical Exam Right shoulder incision is well-healed. No fluid collection no infection. Nontender.    Assessment:     Benign lipoma of right shoulder, recovering uneventfully following excision.  Invasive ductal carcinoma left breast, stage TI B. N0.  Status post bilateral mastectomy, left low biopsy and subsequent reconstructive surgery. No evidence of recurrence to date   Plan:     Continue close followup with Dr. Marikay Alar Magrinat.  Keep appt. with Dr. Renae Gloss at [facility] plastic surgery department regarding consultation and possible revision of reconstructive surgery.  Keep your appt. was Dr. Andrey Spearman regarding your lung nodule and aneurysm.  I will be happy to see you at any time. At this time Dr. Darnelle Catalan is following you frequently and so I will not make a return appointment for you at this time. If you are discharged from Dr. Irven Easterly be happy to resume annual breast checks.

## 2011-05-10 NOTE — Assessment & Plan Note (Signed)
OFFICE VISIT  [name], [name] DOB:  December 06, 1945                                        May 09, 2011 CHART #:  [id number]  REASON FOR VISIT:  One-year followup of ascending aortic aneurysm.  HISTORY OF PRESENT ILLNESS:  The patient is a 65 year old woman with a history of ascending aortic aneurysm, was first noted about 7 years ago and with a 3.5 cm aortic root and has lost to follow up but then in January 2010 had a CT of the chest was noted to be 4 cm.  We have been following her since that time.  I saw her in August of last year at which time, the aneurysm measured 40.5 mm essentially unchanged.  There also was a 5.7-mm superior segment nodule on the right lower lobe which was not particularly suspicious and had been present 18 months previously we would check that again on her CT.  She states that she has not had any dramatic chest pain.  She was having some chest discomfort and had a stress test about 4 months ago.  She did not get the results of that I asked if we could potentially get the result of that study. She is scheduled to go back to [facility] for additional breast reconstruction in about a month or so and she still has pain both sides of the chest under the breasts and sometimes feels like her chest expands after she eats.  CURRENT MEDICATIONS: 1. Lotrel 5/10 one tablet daily. 2. Tamoxifen has been discontinued as takes Xanax p.r.n. 3. Calcium plus D daily. 4. Exemestane. 5. Vitamin D 1000 units daily.  ALLERGIES:  She has allergies to CECLOR and SULFA.  Past medical history, family history, social history are reviewed and unchanged.  REVIEW OF SYSTEMS:  See HPI.  All other systems are negative.  PHYSICAL EXAMINATION:  General:  Ms. Bowlds is a 65 year old woman in no acute distress.  Vital Signs:  Blood pressure is 118/80, pulse 62 and regular, respirations 16, oxygen  saturation 96% on room air.  Neck: Supple without  thyromegaly, adenopathy, or bruits.  Cardiac:  Regular rate and rhythm.  Normal S1 and S2.  No murmurs, rubs, or gallops. Lungs:  Clear with equal breath sounds bilaterally.  Extremities: Without clubbing, cyanosis, or edema.  She has palpable pulses throughout.  LABORATORY DATA:  CT of the chest reviewed, has not been officially read, does appear that the aorta might possibly be minimally enlarged at 41.4 mm where previously it measured 40.8.  The right lung nodule is difficult to localize but appears unchanged.  IMPRESSION:  The patient is a 65 year old woman with an ascending aortic aneurysm that is about 4-4.1 cm in diameter may have grown 0.60 mm or so since her last visit, may have grown by a millimeter over the past 2 years.  The size difference is clinically insignificant but the aneurysm does need to be followed as it is 4 cm and could potentially show signs of more rapid expansion.  I was able to obtain a copy of her stress test.  She had no evidence of ischemia.  She did have a couple of ventricular couplets.  I gave her a copy of that result.  She has an appointment with Dr. Jarold Motto in September and she can discuss that with further if she needs to.  I will plan to see her back in a year with repeat CT angio of the chest to follow up her ascending aortic aneurysm.  Salvatore Decent Dorris Fetch, M.D. Electronically Signed  SCH/MEDQ  D:  05/09/2011  T:  05/10/2011  Job:  [id number]

## 2011-06-23 LAB — COMPREHENSIVE METABOLIC PANEL
ALT: 12
AST: 18
Albumin: 4.5
Calcium: 9.6
Creatinine, Ser: 0.61
GFR calc Af Amer: >60
GFR calc non Af Amer: >60
Sodium: 141
Total Protein: 7.6

## 2011-06-23 LAB — DIFFERENTIAL
Eosinophils Relative: 1
Lymphocytes Relative: 46
Lymphs Abs: 2.3
Monocytes Absolute: 0.4
Monocytes Relative: 9

## 2011-06-23 LAB — URINALYSIS, ROUTINE W REFLEX MICROSCOPIC
Bilirubin Urine: NEGATIVE
Glucose, UA: NEGATIVE
Nitrite: NEGATIVE
Specific Gravity, Urine: 1.004 — ABNORMAL LOW
pH: 6

## 2011-06-23 LAB — CBC
MCHC: 33.3
MCV: 94.9
Platelets: 231

## 2011-06-23 LAB — LACTATE DEHYDROGENASE: LDH: 149

## 2011-07-14 ENCOUNTER — Encounter (HOSPITAL_COMMUNITY): Payer: 59 | Attending: Internal Medicine

## 2011-07-14 DIAGNOSIS — M810 Age-related osteoporosis without current pathological fracture: Secondary | ICD-10-CM | POA: Insufficient documentation

## 2011-07-14 LAB — URINALYSIS, ROUTINE W REFLEX MICROSCOPIC
Nitrite: NEGATIVE
Protein, ur: NEGATIVE
Urobilinogen, UA: 0.2

## 2011-07-14 LAB — COMPREHENSIVE METABOLIC PANEL
ALT: 15
Alkaline Phosphatase: 58
CO2: 28
Calcium: 9.5
GFR calc non Af Amer: >60
Glucose, Bld: 102 — ABNORMAL HIGH
Potassium: 4
Sodium: 140

## 2011-07-14 LAB — DIFFERENTIAL
Basophils Relative: 1
Eosinophils Absolute: 0
Neutrophils Relative %: 52

## 2011-07-14 LAB — CBC
HCT: 41.9
Hemoglobin: 13.8
MCHC: 32.9
RBC: 4.37

## 2011-08-04 ENCOUNTER — Other Ambulatory Visit (HOSPITAL_BASED_OUTPATIENT_CLINIC_OR_DEPARTMENT_OTHER): Payer: 59 | Admitting: Lab

## 2011-08-04 ENCOUNTER — Other Ambulatory Visit: Payer: Self-pay | Admitting: Oncology

## 2011-08-04 DIAGNOSIS — C50319 Malignant neoplasm of lower-inner quadrant of unspecified female breast: Secondary | ICD-10-CM

## 2011-08-04 DIAGNOSIS — Z170 Estrogen receptor positive status [ER+]: Secondary | ICD-10-CM

## 2011-08-04 LAB — CBC WITH DIFFERENTIAL/PLATELET
BASO%: 0.5 % (ref 0.0–2.0)
LYMPH%: 48.6 % (ref 14.0–49.7)
MCHC: 33.2 g/dL (ref 31.5–36.0)
MONO#: 0.4 10*3/uL (ref 0.1–0.9)
Platelets: 244 10*3/uL (ref 145–400)
RBC: 3.99 10*6/uL (ref 3.70–5.45)
WBC: 5.2 10*3/uL (ref 3.9–10.3)
lymph#: 2.5 10*3/uL (ref 0.9–3.3)

## 2011-08-05 LAB — COMPREHENSIVE METABOLIC PANEL
ALT: 10 U/L (ref 0–35)
CO2: 24 mEq/L (ref 19–32)
Sodium: 140 mEq/L (ref 135–145)
Total Bilirubin: 0.4 mg/dL (ref 0.3–1.2)
Total Protein: 7.2 g/dL (ref 6.0–8.3)

## 2011-08-05 LAB — CANCER ANTIGEN 27.29: CA 27.29: 22 U/mL (ref 0–39)

## 2011-08-08 ENCOUNTER — Ambulatory Visit (INDEPENDENT_AMBULATORY_CARE_PROVIDER_SITE_OTHER): Payer: 59 | Admitting: General Surgery

## 2011-08-11 ENCOUNTER — Ambulatory Visit (HOSPITAL_BASED_OUTPATIENT_CLINIC_OR_DEPARTMENT_OTHER): Payer: 59 | Admitting: Oncology

## 2011-08-11 ENCOUNTER — Telehealth: Payer: Self-pay | Admitting: Oncology

## 2011-08-11 VITALS — BP 124/74 | HR 77 | Temp 97.8°F | Ht 62.0 in | Wt 138.9 lb

## 2011-08-11 DIAGNOSIS — C50919 Malignant neoplasm of unspecified site of unspecified female breast: Secondary | ICD-10-CM

## 2011-08-11 DIAGNOSIS — Z170 Estrogen receptor positive status [ER+]: Secondary | ICD-10-CM

## 2011-08-11 DIAGNOSIS — C50319 Malignant neoplasm of lower-inner quadrant of unspecified female breast: Secondary | ICD-10-CM

## 2011-08-11 DIAGNOSIS — M810 Age-related osteoporosis without current pathological fracture: Secondary | ICD-10-CM

## 2011-08-11 DIAGNOSIS — R7309 Other abnormal glucose: Secondary | ICD-10-CM

## 2011-08-11 NOTE — Progress Notes (Signed)
ID: [name]   Interval History: [name] returns today with [name] her husband for followup of her breast cancer. Since her last visit here she completed her reconstruction under [name] at [name]. I believe he will do some tattooing in December, before he returns to the [name]. A second major events since her last visit is her son's wedding he and his bride will be living in [name]  ROS: She has done very well with the breast surgery and has no problems there including no pain redness swelling or other concerns she has some discomfort in her ankles and knees especially in the morning as she gets to walking things get better and she walks between 1 and 2 miles a day in her treadmill. A detailed review of systems was otherwise noncontributory  Medications: I have reviewed the patient's current medications.   Current Outpatient Prescriptions  Medication Sig Dispense Refill  . amlodipine-benazepril (LOTREL) 2.5-10 MG per capsule Take 1 capsule by mouth daily.        Marland Kitchen exemestane (AROMASIN) 25 MG tablet Take 25 mg by mouth daily after breakfast.        . ALPRAZolam (XANAX) 0.5 MG tablet Take 0.5 mg by mouth at bedtime as needed.        . Calcium Carbonate-Vitamin D (CALCIUM + D) 600-200 MG-UNIT TABS Take 1 tablet by mouth daily.        Marland Kitchen VITAMIN D, CHOLECALCIFEROL, PO Take 600 mg by mouth.           Objective: Vital signs in last 24 hours: BP 124/74  Pulse 77  Temp 97.8 F (36.6 C)  Ht 5\' 2"  (1.575 m)  Wt 138 lb 14.4 oz (63.005 kg)  BMI 25.41 kg/m2   Physical Exam:    Sclerae unicteric  Oropharynx clear  No peripheral adenopathy  Lungs clear -- no rales or rhonchi  Heart regular rate and rhythm  Abdomen benign  MSK no focal spinal tenderness, no peripheral edema  Neuro nonfocal  Breast exam: She status post bilateral mastectomies with bilateral implants in place no evidence of dehiscence unusual lumpiness redness swelling or pain  Lab Results: She has a normal CBC C. matte  CA 27-29 and vitamin D 25-hydroxy level, the only abnormality being a nonfasting glucose of 152.   Studies/Results: CT of the chest shows her aneurysm to be stable at approximately 4 cm. 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  Plan:  The plan is to continue exemestane at least until July of 2014. She will see me again in 6 months with repeat lab work. I discussed the elevated glucose with her today and she understands that in addition to continuing her exercise program she should avoid concentrated sweets. She will continue her osteoporosis management through [name], and she is followed closely by [name] as far as her ascending aortic aneurysm is concerned. She knows to call for any problems that may develop before the next visit.  [name] C 08/11/2011

## 2011-08-11 NOTE — Telephone Encounter (Signed)
Gv pt appt for may2013 

## 2011-11-15 ENCOUNTER — Other Ambulatory Visit: Payer: Self-pay

## 2011-11-15 ENCOUNTER — Encounter (HOSPITAL_COMMUNITY): Payer: Self-pay

## 2011-11-15 ENCOUNTER — Emergency Department (HOSPITAL_COMMUNITY): Payer: 59

## 2011-11-15 ENCOUNTER — Emergency Department (HOSPITAL_COMMUNITY)
Admission: EM | Admit: 2011-11-15 | Discharge: 2011-11-15 | Disposition: A | Discharge status: Home Health | Payer: 59 | Attending: Emergency Medicine | Admitting: Emergency Medicine

## 2011-11-15 DIAGNOSIS — R0602 Shortness of breath: Secondary | ICD-10-CM | POA: Insufficient documentation

## 2011-11-15 DIAGNOSIS — K449 Diaphragmatic hernia without obstruction or gangrene: Secondary | ICD-10-CM | POA: Insufficient documentation

## 2011-11-15 DIAGNOSIS — I712 Thoracic aortic aneurysm, without rupture, unspecified: Secondary | ICD-10-CM | POA: Insufficient documentation

## 2011-11-15 DIAGNOSIS — Z853 Personal history of malignant neoplasm of breast: Secondary | ICD-10-CM | POA: Insufficient documentation

## 2011-11-15 DIAGNOSIS — Z79899 Other long term (current) drug therapy: Secondary | ICD-10-CM | POA: Insufficient documentation

## 2011-11-15 DIAGNOSIS — R079 Chest pain, unspecified: Secondary | ICD-10-CM | POA: Insufficient documentation

## 2011-11-15 DIAGNOSIS — I10 Essential (primary) hypertension: Secondary | ICD-10-CM | POA: Insufficient documentation

## 2011-11-15 LAB — DIFFERENTIAL
Basophils Relative: 1 % (ref 0–1)
Eosinophils Absolute: 0 10*3/uL (ref 0.0–0.7)
Eosinophils Relative: 1 % (ref 0–5)
Monocytes Absolute: 0.3 10*3/uL (ref 0.1–1.0)
Monocytes Relative: 8 % (ref 3–12)
Neutrophils Relative %: 47 % (ref 43–77)

## 2011-11-15 LAB — BASIC METABOLIC PANEL
Chloride: 106 mEq/L (ref 96–112)
Creatinine, Ser: 0.68 mg/dL (ref 0.50–1.10)
GFR calc Af Amer: >90 mL/min (ref >90)
GFR calc non Af Amer: >90 mL/min (ref >90)
Potassium: 3.7 mEq/L (ref 3.5–5.1)

## 2011-11-15 LAB — CBC
HCT: 40.8 % (ref 36.0–46.0)
Hemoglobin: 12.9 g/dL (ref 12.0–15.0)
MCH: 30.1 pg (ref 26.0–34.0)
MCHC: 31.6 g/dL (ref 30.0–36.0)
MCV: 95.1 fL (ref 78.0–100.0)

## 2011-11-15 LAB — TROPONIN I
Troponin I: <0.3 ng/mL (ref <0.30)
Troponin I: <0.3 ng/mL (ref <0.30)

## 2011-11-15 LAB — POCT I-STAT TROPONIN I

## 2011-11-15 MED ORDER — MORPHINE SULFATE 4 MG/ML IJ SOLN
4.0000 mg | Freq: Once | INTRAMUSCULAR | Status: AC
  Administered 2011-11-15: 4 mg via INTRAVENOUS
  Filled 2011-11-15: qty 1

## 2011-11-15 MED ORDER — IOHEXOL 300 MG/ML  SOLN
100.0000 mL | Freq: Once | INTRAMUSCULAR | Status: AC | PRN
  Administered 2011-11-15: 100 mL via INTRAVENOUS

## 2011-11-15 MED ORDER — OXYCODONE-ACETAMINOPHEN 5-325 MG PO TABS: 1.0000 | ORAL_TABLET | ORAL | Status: AC | PRN

## 2011-11-15 MED ORDER — HYDROMORPHONE HCL PF 1 MG/ML IJ SOLN
1.0000 mg | Freq: Once | INTRAMUSCULAR | Status: AC
  Administered 2011-11-15: 1 mg via INTRAVENOUS
  Filled 2011-11-15: qty 1

## 2011-11-15 NOTE — ED Provider Notes (Signed)
History     CSN: [id number]  Arrival date & time 11/15/11  1023   First MD Initiated Contact with Patient 11/15/11 1118      Chief Complaint  Patient presents with  . Chest Pain    (Consider location/radiation/quality/duration/timing/severity/associated sxs/prior treatment) HPI  65yoF h/o breast ca s/p b/l mastectomy in remission, ascending thoracic aortic aneurysm (4cm), excision presents with chest pain. The patient states that she's experienced intermittent chest pain which is sharp and substernal for the past 2 weeks. She states that yesterday she began to exhibit a different chest pain which he describes as a severe pressure on the right side of her chest radiating to her right shoulder and right upper extremity. She states that the pressure is much worse when she takes a deep breath and sometimes worse when she moves her arm. She has not taken anything for pain at home prior to arrival. She complains of intermittent shortness of breath. Her symptoms are not worse with exertion. She is not complaining of nausea, vomiting, diaphoresis. She denies back pain. She denies new numbness, weakness in her extremities. History of similar in the past. Denies h/o VTE in self or family. No recent hosp/surg/immob.  She takes anti estrogen hormone, aromasin. no leg pain or swelling    ED Notes, ED Provider Notes from 11/15/11 0000 to 11/15/11 10:32:31       Kathe Becton, RN 11/15/2011 10:28      Rt. Side of chest pain began yesterday, intermittent pain and then this am was constant, sharp pain that radiates into the back,. Pt. Is sob and nauseated     Past Medical History  Diagnosis Date  . Thoracic aortic aneurysm     Dr. Dorris Fetch  . Breast cancer     s/p bilateral mastectomies  . Hiatal hernia   . HTN (hypertension)     Past Surgical History  Procedure Date  . Mastectomy     bilateral  . Gastric fundoplication   . Breast reconstruction     bilateral & implants  . Abdominal  hysterectomy 1984  . Cesarean section 1977  . Hemorroidectomy     Family History  Problem Relation Age of Onset  . Diabetes Mother   . Heart disease Father   . Hyperlipidemia Sister   . Diabetes Sister   . Diabetes Sister   . Hyperlipidemia Sister     History  Substance Use Topics  . Smoking status: Never Smoker   . Smokeless tobacco: Not on file  . Alcohol Use: No    OB History    Grav Para Term Preterm Abortions TAB SAB Ect Mult Living                  Review of Systems  All other systems reviewed and are negative.   except as noted HPI   Allergies  Ceclor and Sulfa antibiotics  Home Medications   Current Outpatient Rx  Name Route Sig Dispense Refill  . ALPRAZOLAM 0.5 MG PO TABS Oral Take 0.5 mg by mouth at bedtime as needed. For sleep    . AMLODIPINE BESY-BENAZEPRIL HCL 5-10 MG PO CAPS Oral Take 1 capsule by mouth daily.    Marland Kitchen VITAMIN D 2000 UNITS PO TABS Oral Take 2,000 Units by mouth daily.    Marland Kitchen EXEMESTANE 25 MG PO TABS Oral Take 25 mg by mouth daily after breakfast.    . CALCIUM CARBONATE-VITAMIN D 600-200 MG-UNIT PO TABS Oral Take 1 tablet by mouth daily.      Marland Kitchen  OXYCODONE-ACETAMINOPHEN 5-325 MG PO TABS Oral Take 1 tablet by mouth every 4 (four) hours as needed for pain. 15 tablet 0    BP 117/72  Pulse 57  Temp(Src) 98.6 F (37 C) (Oral)  Resp 17  Ht 5\' 2"  (1.575 m)  Wt 137 lb (62.143 kg)  BMI 25.06 kg/m2  SpO2 97%  Physical Exam  Nursing note and vitals reviewed. Constitutional: She is oriented to person, place, and time. She appears well-developed.       Appears to be in pain  HENT:  Head: Atraumatic.  Mouth/Throat: Oropharynx is clear and moist.  Eyes: Conjunctivae and EOM are normal. Pupils are equal, round, and reactive to light.  Neck: Normal range of motion. Neck supple.  Cardiovascular: Normal rate, regular rhythm, normal heart sounds and intact distal pulses.   Pulmonary/Chest: Effort normal and breath sounds normal. No respiratory  distress. She has no wheezes. She has no rales. She exhibits no tenderness.  Abdominal: Soft. She exhibits no distension. There is no tenderness. There is no rebound and no guarding.  Musculoskeletal: Normal range of motion. She exhibits no edema and no tenderness.       R shoulder .ecchymosis, deformity. There is no tenderness to palpation. Strength is 5 out of 5 in her right upper extremity.  Neurological: She is alert and oriented to person, place, and time.  Skin: Skin is warm and dry. No rash noted.  Psychiatric: She has a normal mood and affect.    Date: 11/15/2011  Rate: 83  Rhythm: normal sinus rhythm  QRS Axis: normal  Intervals: normal  ST/T Wave abnormalities: normal  Conduction Disutrbances:none  Narrative Interpretation:   Old EKG Reviewed: changes noted   ED Course  Procedures (including critical care time)   Labs Reviewed  CBC  DIFFERENTIAL  BASIC METABOLIC PANEL  TROPONIN I  POCT I-STAT TROPONIN I  TROPONIN I  LAB REPORT - SCANNED   Dg Chest 2 View  11/15/2011  *RADIOLOGY REPORT*  Clinical Data: Right-sided chest pain, shortness of breath, history of breast cancer and aortic aneurysm  CHEST - 2 VIEW  Comparison: September 01, 2009  Findings: The cardiac silhouette, mediastinum, pulmonary vasculature are within normal limits.  Aortic ectasia appears unchanged.  Both lungs are clear.  The osseous structures are unremarkable.  IMPRESSION: Stable chest x-ray with no evidence of acute cardiac or pulmonary process.  Original Report Authenticated By: Brandon Melnick, M.D.   Ct Angio Chest W/cm &/or Wo Cm  11/15/2011  *RADIOLOGY REPORT*  Clinical Data: History of thoracic aortic aneurysm, breast carcinoma with mastectomy, now with sharp right chest pain radiating to the back and some shortness of breath  CT ANGIOGRAPHY CHEST  Technique:  Multidetector CT imaging of the chest using the standard protocol during bolus administration of intravenous contrast. Multiplanar  reconstructed images including MIPs were obtained and reviewed to evaluate the vascular anatomy.  Contrast: OMNIPAQUE IOHEXOL 300 MG/ML IV SOLN  Comparison: CT angio chest of 05/09/2011  Findings: The fusiform prominence of the ascending aorta is stable with maximum diameter of 4.0 x 3.9 cm compared to 4.0 cm previously.  No focal distention is seen.  The aortic arch is normal in caliber and the origins of the great vessels are patent. The descending thoracic aorta is normal in caliber. The pulmonary arteries opacify with no significant abnormality noted.  Surgical clips are present around the gastroesophageal junction.  No abnormality of the upper abdomen is seen.  There is cardiomegaly present.  No mediastinal or hilar adenopathy is noted.  On the lung window images, no lung parenchymal abnormality is seen. No effusion is noted.  No bony abnormality is seen  IMPRESSION:   Stable fusiform prominence of the ascending aorta with maximal diameter of 4.0 cm.  Original Report Authenticated By: Juline Patch, M.D.     1. Chest pain      MDM  Patient presents with chest pain. He does have a history of breast cancer as well as a history of a descending thoracic aneurysm measuring 4 cm in September. The patient appears to be in pain at this time. Differential diagnosis is broad including worsening or rupturing aneurysm, pulmonary embolus and, acute coronary syndrome, pneumothorax, pneumonia less likely. Morphine ordered. Nursing staff aware the patient is to go for a CT angiogram as soon as possible.  CTA negative for aneurysm leak, change in size. Pulmonary arteries without PE. She has two troponins which have been negative and her pain as been ongoing > 6 hours. Pain controlled at this time. Comfortable with discharge home outpatient f/u with her PMD.      Forbes Cellar, MD 11/16/11 1148

## 2011-11-15 NOTE — ED Notes (Signed)
Pt received to RM 4 with c/o chest pain onset yesterday radiating to rt arm and back, that waxes and wanes. Pt is attached to the cardiac monitor. Family at bedside

## 2011-11-15 NOTE — ED Notes (Signed)
Rt. Side of chest pain began yesterday, intermittent pain and then this am was constant, sharp pain that radiates into the back,. Pt. Is sob and nauseated

## 2011-11-15 NOTE — Discharge Instructions (Signed)
Chest Pain, Nonspecific Today you have had an exam and tests to determine a specific cause for your chest pain. It is often hard to give a specific diagnosis as the cause of one's chest pain. There is always a chance that your pain could be related to something serious, like a heart attack or a blood clot in the lungs. You need to follow up with your caregiver for further evaluation. More lab tests or other studies such as x-rays, an electrocardiogram, stress testing, or cardiac imaging may be needed to find the cause of your pain. Most of the time nonspecific chest pain will be improved within 2-3 days of rest and mild pain medicine. For the next few days avoid physical exertion or activities that bring on the pain. Do not smoke or drink alcohol until all your symptoms are gone. Quitting smoking is the number one way to reduce your risk for heart and lung disease. Call your caregiver for routine follow-up as advised.  SEEK IMMEDIATE MEDICAL CARE IF:  You develop increased chest pain, or pain that radiates to the arm, neck, jaw, back or abdomen.   You develop shortness of breath, increasing cough or coughing up blood.   You have severe back or abdominal pain, nausea or vomiting.   You develop severe weakness, fainting, fever or chills.  Document Released: 09/12/2005 Document Revised: 05/25/2011 Document Reviewed: 03/02/2007 [employer] Patient Information 2012 [employer]. 

## 2011-11-15 NOTE — ED Notes (Signed)
MD at bedside. 

## 2011-12-01 ENCOUNTER — Telehealth (INDEPENDENT_AMBULATORY_CARE_PROVIDER_SITE_OTHER): Payer: Self-pay | Admitting: General Surgery

## 2011-12-01 NOTE — Telephone Encounter (Signed)
PT CALLED RE HER UPCOMING APPT WITH DR. Derrell Lolling ON 12-29-11/ IT IS FOR BREAST CK. SHE IS ALSO EXPERIENCING SOME INTERMITTENT NERVE PAIN UNDER AXILLA AFTER BREAST RECONSTRUCTION SURGERY AT [place] [place]. SHE WAS INQUIRING IF SHE COULD SEE HIM EARLIER/ SHE HAS NO OTHER BREAST SYMPTOMS/ NO FEVER OR REDNESS OF BREAST/ SHE ALSO HA ENTAL SURGERY/CROWN AND THEY IRRITATED HER NERVE IN TOOTH AN SHE THOUGHT THAT SOME OF NERVE PAIN MIGHT BE  DUE TO DENTAL PROCEDURE/ I TOLD HER TO CHECK WITH HER DENTIST TO SEE IF HER UNDER ARM NERVE PAIN IS RELATED/ HER APPOINTMENT ON 12-29-11 IS DR. INGRAM'S FIRST AVAILABLE/ SHE WILL CALL IF SYMPTOMS BECOME WORSE/GY

## 2011-12-29 ENCOUNTER — Ambulatory Visit (INDEPENDENT_AMBULATORY_CARE_PROVIDER_SITE_OTHER): Payer: 59 | Admitting: General Surgery

## 2012-01-09 ENCOUNTER — Other Ambulatory Visit: Payer: Self-pay | Admitting: *Deleted

## 2012-01-09 DIAGNOSIS — I10 Essential (primary) hypertension: Secondary | ICD-10-CM

## 2012-01-09 MED ORDER — AMLODIPINE BESY-BENAZEPRIL HCL 5-10 MG PO CAPS: 1.0000 | ORAL_CAPSULE | Freq: Every day | ORAL | Status: DC

## 2012-02-02 ENCOUNTER — Other Ambulatory Visit (HOSPITAL_BASED_OUTPATIENT_CLINIC_OR_DEPARTMENT_OTHER): Payer: 59 | Admitting: Lab

## 2012-02-02 DIAGNOSIS — C50919 Malignant neoplasm of unspecified site of unspecified female breast: Secondary | ICD-10-CM

## 2012-02-02 LAB — CBC WITH DIFFERENTIAL/PLATELET
BASO%: 0.5 % (ref 0.0–2.0)
Basophils Absolute: 0 10*3/uL (ref 0.0–0.1)
EOS%: 0.7 % (ref 0.0–7.0)
HCT: 39.3 % (ref 34.8–46.6)
MCH: 31.6 pg (ref 25.1–34.0)
MCHC: 32.5 g/dL (ref 31.5–36.0)
MCV: 97.4 fL (ref 79.5–101.0)
MONO%: 8.2 % (ref 0.0–14.0)
NEUT%: 53.9 % (ref 38.4–76.8)
lymph#: 1.6 10*3/uL (ref 0.9–3.3)

## 2012-02-03 LAB — COMPREHENSIVE METABOLIC PANEL
AST: 15 U/L (ref 0–37)
Alkaline Phosphatase: 59 U/L (ref 39–117)
BUN: 23 mg/dL (ref 6–23)
Creatinine, Ser: 0.74 mg/dL (ref 0.50–1.10)
Glucose, Bld: 83 mg/dL (ref 70–99)
Potassium: 3.6 mEq/L (ref 3.5–5.3)
Total Bilirubin: 0.6 mg/dL (ref 0.3–1.2)

## 2012-02-03 LAB — VITAMIN D 25 HYDROXY (VIT D DEFICIENCY, FRACTURES): Vit D, 25-Hydroxy: 67 ng/mL (ref 30–89)

## 2012-02-09 ENCOUNTER — Ambulatory Visit (HOSPITAL_BASED_OUTPATIENT_CLINIC_OR_DEPARTMENT_OTHER): Payer: 59 | Admitting: Oncology

## 2012-02-09 ENCOUNTER — Telehealth: Payer: Self-pay | Admitting: Oncology

## 2012-02-09 VITALS — BP 122/75 | HR 71 | Temp 98.3°F | Ht 62.0 in | Wt 138.4 lb

## 2012-02-09 DIAGNOSIS — C50919 Malignant neoplasm of unspecified site of unspecified female breast: Secondary | ICD-10-CM | POA: Insufficient documentation

## 2012-02-09 DIAGNOSIS — C50219 Malignant neoplasm of upper-inner quadrant of unspecified female breast: Secondary | ICD-10-CM

## 2012-02-09 DIAGNOSIS — I10 Essential (primary) hypertension: Secondary | ICD-10-CM

## 2012-02-09 MED ORDER — EXEMESTANE 25 MG PO TABS: 25.0000 mg | ORAL_TABLET | Freq: Every day | ORAL | Status: DC

## 2012-02-09 MED ORDER — AMLODIPINE BESY-BENAZEPRIL HCL 5-10 MG PO CAPS: 1.0000 | ORAL_CAPSULE | Freq: Every day | ORAL | Status: DC

## 2012-02-09 NOTE — Telephone Encounter (Signed)
gve the pt her may 2014 appt calendar 

## 2012-02-09 NOTE — Progress Notes (Signed)
ID: [name]   DOB: 1946/09/05  MR#: [id number]  WUJ#:[id number]  HISTORY OF PRESENT ILLNESS:  [name] had a digital screening mammogram at the breast cancer on Feb 14, 2008.  This showed a possible mass in the left breast.  On the 27th she returned for diagnostic left mammography and ultrasound.  There was no palpable mass noted by Dr. Deboraha Sprang, but spot compression views did show multiple round nodules in the medial aspect of the left breast.  By ultrasound there was a dilated ductal system and two small hypoechoic masses measuring approximately 7 and 6 mm in size.  On June 3 the patient underwent ultrasound guided needle localization of the breast mass and the pathology showed an invasive mammary carcinoma ([id number] and [id number]) which was a 100% ER and a 100% PR positive.  The MIB-1 was 10% and the HER2/neu test was negative at 1+.    With this information the patient was referred to Dr. Derrell Lolling, preoperative chest x-ray showed some density in the retrocardiac areas which was felt probably to be artifactual, but just to be sure a chest CT was obtained July 10, this showed indeed that shadow to be an artifactual superimposition of normal structures.  There was a 5 mm small interstitial nodule in the right upper lobe, which is obviously nonspecific and perhaps at some point in the future could be reimaged.  The patient then proceeded to definitive surgery and because she has had multiple biopsies previously for papillomas she opted for bilateral mastectomies with left sentinel lymph node sampling.  This was performed April 04, 2008 and showed ([id number]) no residual carcinoma in the breast, both lymph nodes negative and the right breast benign. Her subsequent history is as detailed below.  INTERVAL HISTORY: [name] returns today with her husband Billey Gosling for followup of her breast cancer. She is tolerating the exemestane much better than any of the other anti-estrogens as she has taken. She is looking forward to  completing her followup here next year.  REVIEW OF SYSTEMS: She has minimal to no hot flashes. She does have vaginal dryness but this is not worse than it has been in the past few years. She developed some pain in the right axilla and right flank. She was treated with Neurontin for this; that has completely resolved. She has occasional joint pains which are not more persistent or intense than previously. She does is about 2-3 times a week and a treadmill at least once a week. Overall a detailed review of systems was stable  PAST MEDICAL HISTORY: Past Medical History  Diagnosis Date  . Thoracic aortic aneurysm     Dr. Dorris Fetch  . Breast cancer     s/p bilateral mastectomies  . Hiatal hernia   . HTN (hypertension)   Significant for C-section x 1, remote hysterectomy with bilateral salpingo-oophorectomy, multiple biopsies on lumpectomies in the past, history of Nissen fundoplication, history of cholecystectomy, history of appendectomy, history hemorrhoidectomy, history of left shoulder surgery, hypertension and history of nephrolithiasis, status post lithotripsy.   PAST SURGICAL HISTORY: Past Surgical History  Procedure Date  . Mastectomy     bilateral  . Gastric fundoplication   . Breast reconstruction     bilateral & implants  . Abdominal hysterectomy 1984  . Cesarean section 1977  . Hemorroidectomy     FAMILY HISTORY Family History  Problem Relation Age of Onset  . Diabetes Mother   . Heart disease Father   . Hyperlipidemia Sister   . Diabetes Sister   .  Diabetes Sister   . Hyperlipidemia Sister   The patient's father died from a stroke, the patient's mother from diabetes in the setting of COPD (in a nonsmoker), one of the patient's sisters died from uterine cancer, two other sisters have diabetes.  There is no history of breast cancer in the family except for a niece who was diagnosed at age 65.  GYNECOLOGIC HISTORY: She is Gx, P1, first pregnancy to term age 39, last  menstrual period with hysterectomy in 1984.  She took hormones for more than 20 years, stopping only when she had this diagnosis.  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 Her husband Billey Gosling used to work for [employer]. They're both now retired. Their son lives in [place], works in Airline pilot. The patient has no brain children "and we are not expecting any". She attends a [employer].   ADVANCED DIRECTIVES:  HEALTH MAINTENANCE: History  Substance Use Topics  . Smoking status: Never Smoker   . Smokeless tobacco: Not on file  . Alcohol Use: No     Colonoscopy:[facility]  PAP: [facility]  Bone density: May 2012/osteoporosis  Lipid panel:  Allergies  Allergen Reactions  . Ceclor (Cefaclor) Rash    All over body.  . Sulfa Antibiotics     Ulcer in mouth    Current Outpatient Prescriptions  Medication Sig Dispense Refill  . ALPRAZolam (XANAX) 0.5 MG tablet Take 0.5 mg by mouth at bedtime as needed. For sleep      . amLODipine-benazepril (LOTREL) 5-10 MG per capsule Take 1 capsule by mouth daily.  90 capsule  0  . Calcium Carbonate-Vitamin D (CALCIUM + D) 600-200 MG-UNIT TABS Take 1 tablet by mouth daily.        . Cholecalciferol (VITAMIN D) 2000 UNITS tablet Take 2,000 Units by mouth daily.      Marland Kitchen exemestane (AROMASIN) 25 MG tablet Take 25 mg by mouth daily after breakfast.        OBJECTIVE: Middle-aged white woman who appears well Filed Vitals:   02/09/12 1029  BP: 122/75  Pulse: 71  Temp: 98.3 F (36.8 C)     Body mass index is 25.31 kg/(m^2).    ECOG FS: 1  Sclerae unicteric Oropharynx clear No peripheral adenopathy Lungs no rales or rhonchi Heart regular rate and rhythm Abd benign MSK no focal spinal tenderness, no peripheral edema Neuro: nonfocal Breasts: She is status post bilateral mastectomies with bilateral implant reconstruction. There is no evidence of local recurrence  LAB RESULTS: Lab Results  Component Value  Date   WBC 4.4 02/02/2012   NEUTROABS 2.3 02/02/2012   HGB 12.8 02/02/2012   HCT 39.3 02/02/2012   MCV 97.4 02/02/2012   PLT 233 02/02/2012      Chemistry      Component Value Date/Time   NA 142 02/02/2012 0950   K 3.6 02/02/2012 0950   CL 105 02/02/2012 0950   CO2 25 02/02/2012 0950   BUN 23 02/02/2012 0950   CREATININE 0.74 02/02/2012 0950      Component Value Date/Time   CALCIUM 9.5 02/02/2012 0950   ALKPHOS 59 02/02/2012 0950   AST 15 02/02/2012 0950   ALT 9 02/02/2012 0950   BILITOT 0.6 02/02/2012 0950       Lab Results  Component Value Date   LABCA2 21 02/02/2012    No components found with this basename: [id number]    No results found for this basename: INR:1;PROTIME:1 in  the last 168 hours  Urinalysis    Component Value Date/Time   COLORURINE YELLOW 04/02/2008 1249   APPEARANCEUR CLEAR 04/02/2008 1249   LABSPEC 1.004* 04/02/2008 1249   PHURINE 6.0 04/02/2008 1249   GLUCOSEU NEGATIVE 04/02/2008 1249   HGBUR NEGATIVE 04/02/2008 1249   BILIRUBINUR NEGATIVE 04/02/2008 1249   KETONESUR NEGATIVE 04/02/2008 1249   PROTEINUR NEGATIVE 04/02/2008 1249   UROBILINOGEN 0.2 04/02/2008 1249   NITRITE NEGATIVE 04/02/2008 1249   LEUKOCYTESUR NEGATIVE MICROSCOPIC NOT DONE ON URINES WITH NEGATIVE PROTEIN, BLOOD, LEUKOCYTES, NITRITE, OR GLUCOSE <1000 mg/dL. 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 At her request I also refilled her blood pressure medication. She knows to call for any problems that may develop before the next visit   Tannya Gonet C    02/09/2012

## 2012-02-23 ENCOUNTER — Telehealth (INDEPENDENT_AMBULATORY_CARE_PROVIDER_SITE_OTHER): Payer: Self-pay

## 2012-02-23 NOTE — Telephone Encounter (Signed)
LMOM that Dr Derrell Lolling will be out of office for 2 weeks coming up. If pt is having itching with no mass or thickness she probably needs to see her plastic surgeon that placed her implant. If she has mass or new skin changes she needs to leave a msg with exact symptoms and what she sees on skin. I can have Dr Derrell Lolling view for appt if we have more information.

## 2012-03-16 ENCOUNTER — Other Ambulatory Visit: Payer: Self-pay | Admitting: *Deleted

## 2012-03-16 DIAGNOSIS — C50919 Malignant neoplasm of unspecified site of unspecified female breast: Secondary | ICD-10-CM

## 2012-03-16 MED ORDER — EXEMESTANE 25 MG PO TABS: 25.0000 mg | ORAL_TABLET | Freq: Every day | ORAL | Status: AC

## 2012-04-19 ENCOUNTER — Ambulatory Visit (INDEPENDENT_AMBULATORY_CARE_PROVIDER_SITE_OTHER): Payer: 59 | Admitting: General Surgery

## 2012-04-23 ENCOUNTER — Encounter (INDEPENDENT_AMBULATORY_CARE_PROVIDER_SITE_OTHER): Payer: Self-pay | Admitting: General Surgery

## 2012-05-02 ENCOUNTER — Other Ambulatory Visit: Payer: Self-pay | Admitting: Thoracic Surgery (Cardiothoracic Vascular Surgery)

## 2012-05-02 DIAGNOSIS — I712 Thoracic aortic aneurysm, without rupture: Secondary | ICD-10-CM

## 2012-05-08 ENCOUNTER — Other Ambulatory Visit: Payer: Self-pay | Admitting: Thoracic Surgery (Cardiothoracic Vascular Surgery)

## 2012-05-08 DIAGNOSIS — I712 Thoracic aortic aneurysm, without rupture: Secondary | ICD-10-CM

## 2012-05-22 ENCOUNTER — Other Ambulatory Visit: Payer: 59

## 2012-05-22 ENCOUNTER — Encounter: Payer: 59 | Admitting: Thoracic Surgery (Cardiothoracic Vascular Surgery)

## 2012-06-20 DIAGNOSIS — R197 Diarrhea, unspecified: Secondary | ICD-10-CM | POA: Diagnosis not present

## 2012-06-21 DIAGNOSIS — R82998 Other abnormal findings in urine: Secondary | ICD-10-CM | POA: Diagnosis not present

## 2012-06-21 DIAGNOSIS — M949 Disorder of cartilage, unspecified: Secondary | ICD-10-CM | POA: Diagnosis not present

## 2012-06-21 DIAGNOSIS — M899 Disorder of bone, unspecified: Secondary | ICD-10-CM | POA: Diagnosis not present

## 2012-06-21 DIAGNOSIS — Z79899 Other long term (current) drug therapy: Secondary | ICD-10-CM | POA: Diagnosis not present

## 2012-06-21 DIAGNOSIS — I10 Essential (primary) hypertension: Secondary | ICD-10-CM | POA: Diagnosis not present

## 2012-06-28 DIAGNOSIS — Z0000 Encounter for general adult medical examination without abnormal findings: Secondary | ICD-10-CM | POA: Diagnosis not present

## 2012-06-28 DIAGNOSIS — R3129 Other microscopic hematuria: Secondary | ICD-10-CM | POA: Diagnosis not present

## 2012-06-28 DIAGNOSIS — I10 Essential (primary) hypertension: Secondary | ICD-10-CM | POA: Diagnosis not present

## 2012-06-28 DIAGNOSIS — N200 Calculus of kidney: Secondary | ICD-10-CM | POA: Diagnosis not present

## 2012-06-28 DIAGNOSIS — F411 Generalized anxiety disorder: Secondary | ICD-10-CM | POA: Diagnosis not present

## 2012-06-28 DIAGNOSIS — R82998 Other abnormal findings in urine: Secondary | ICD-10-CM | POA: Diagnosis not present

## 2012-06-29 DIAGNOSIS — M546 Pain in thoracic spine: Secondary | ICD-10-CM | POA: Diagnosis not present

## 2012-06-29 DIAGNOSIS — N200 Calculus of kidney: Secondary | ICD-10-CM | POA: Diagnosis not present

## 2012-07-13 ENCOUNTER — Ambulatory Visit (HOSPITAL_COMMUNITY)
Admission: RE | Admit: 2012-07-13 | Discharge: 2012-07-13 | Disposition: A | Discharge status: Home / Self Care | Payer: Medicare Other | Source: Ambulatory Visit | Attending: Internal Medicine | Admitting: Internal Medicine

## 2012-07-13 ENCOUNTER — Encounter (HOSPITAL_COMMUNITY): Payer: Self-pay

## 2012-07-13 DIAGNOSIS — M810 Age-related osteoporosis without current pathological fracture: Secondary | ICD-10-CM | POA: Insufficient documentation

## 2012-07-13 MED ORDER — SODIUM CHLORIDE 0.9 % IV SOLN
Freq: Once | INTRAVENOUS | Status: AC
  Administered 2012-07-13: 12:00:00 via INTRAVENOUS

## 2012-07-13 MED ORDER — ZOLEDRONIC ACID 5 MG/100ML IV SOLN
5.0000 mg | Freq: Once | INTRAVENOUS | Status: AC
  Administered 2012-07-13: 5 mg via INTRAVENOUS
  Filled 2012-07-13: qty 300

## 2012-07-17 ENCOUNTER — Encounter (INDEPENDENT_AMBULATORY_CARE_PROVIDER_SITE_OTHER): Payer: Self-pay | Admitting: General Surgery

## 2012-07-17 ENCOUNTER — Emergency Department (HOSPITAL_COMMUNITY)
Admission: EM | Admit: 2012-07-17 | Discharge: 2012-07-17 | Disposition: A | Discharge status: Home / Self Care | Payer: Medicare Other | Source: Home / Self Care

## 2012-07-17 ENCOUNTER — Encounter (HOSPITAL_COMMUNITY): Payer: Self-pay | Admitting: *Deleted

## 2012-07-17 ENCOUNTER — Emergency Department (HOSPITAL_COMMUNITY)
Admission: EM | Admit: 2012-07-17 | Discharge: 2012-07-18 | Disposition: A | Discharge status: Home / Self Care | Payer: Medicare Other | Attending: Emergency Medicine | Admitting: Emergency Medicine

## 2012-07-17 ENCOUNTER — Emergency Department (HOSPITAL_COMMUNITY): Payer: Medicare Other

## 2012-07-17 ENCOUNTER — Ambulatory Visit (INDEPENDENT_AMBULATORY_CARE_PROVIDER_SITE_OTHER): Payer: 59 | Admitting: General Surgery

## 2012-07-17 ENCOUNTER — Telehealth (INDEPENDENT_AMBULATORY_CARE_PROVIDER_SITE_OTHER): Payer: Self-pay | Admitting: General Surgery

## 2012-07-17 ENCOUNTER — Encounter (HOSPITAL_COMMUNITY): Payer: Self-pay

## 2012-07-17 DIAGNOSIS — M549 Dorsalgia, unspecified: Secondary | ICD-10-CM | POA: Insufficient documentation

## 2012-07-17 DIAGNOSIS — R110 Nausea: Secondary | ICD-10-CM | POA: Insufficient documentation

## 2012-07-17 DIAGNOSIS — N211 Calculus in urethra: Secondary | ICD-10-CM | POA: Diagnosis not present

## 2012-07-17 DIAGNOSIS — C50919 Malignant neoplasm of unspecified site of unspecified female breast: Secondary | ICD-10-CM | POA: Insufficient documentation

## 2012-07-17 DIAGNOSIS — N200 Calculus of kidney: Secondary | ICD-10-CM | POA: Insufficient documentation

## 2012-07-17 DIAGNOSIS — Z8719 Personal history of other diseases of the digestive system: Secondary | ICD-10-CM | POA: Insufficient documentation

## 2012-07-17 DIAGNOSIS — N949 Unspecified condition associated with female genital organs and menstrual cycle: Secondary | ICD-10-CM | POA: Diagnosis not present

## 2012-07-17 DIAGNOSIS — I712 Thoracic aortic aneurysm, without rupture, unspecified: Secondary | ICD-10-CM | POA: Insufficient documentation

## 2012-07-17 DIAGNOSIS — N201 Calculus of ureter: Secondary | ICD-10-CM | POA: Insufficient documentation

## 2012-07-17 DIAGNOSIS — I10 Essential (primary) hypertension: Secondary | ICD-10-CM | POA: Diagnosis not present

## 2012-07-17 DIAGNOSIS — Z79899 Other long term (current) drug therapy: Secondary | ICD-10-CM | POA: Diagnosis not present

## 2012-07-17 DIAGNOSIS — R109 Unspecified abdominal pain: Secondary | ICD-10-CM | POA: Insufficient documentation

## 2012-07-17 DIAGNOSIS — N23 Unspecified renal colic: Secondary | ICD-10-CM

## 2012-07-17 LAB — URINE MICROSCOPIC-ADD ON

## 2012-07-17 LAB — CBC WITH DIFFERENTIAL/PLATELET
Basophils Absolute: 0 10*3/uL (ref 0.0–0.1)
Eosinophils Relative: 0 % (ref 0–5)
HCT: 40.7 % (ref 36.0–46.0)
Hemoglobin: 13.6 g/dL (ref 12.0–15.0)
Lymphocytes Relative: 26 % (ref 12–46)
Lymphs Abs: 1.6 10*3/uL (ref 0.7–4.0)
MCV: 95.5 fL (ref 78.0–100.0)
Monocytes Absolute: 0.4 10*3/uL (ref 0.1–1.0)
Neutro Abs: 4.3 10*3/uL (ref 1.7–7.7)
RBC: 4.26 MIL/uL (ref 3.87–5.11)
RDW: 12.7 % (ref 11.5–15.5)
WBC: 6.3 10*3/uL (ref 4.0–10.5)

## 2012-07-17 LAB — URINALYSIS, ROUTINE W REFLEX MICROSCOPIC
Glucose, UA: NEGATIVE mg/dL
Ketones, ur: NEGATIVE mg/dL
pH: 5.5 (ref 5.0–8.0)

## 2012-07-17 LAB — BASIC METABOLIC PANEL
CO2: 24 mEq/L (ref 19–32)
Calcium: 9.1 mg/dL (ref 8.4–10.5)
Chloride: 103 mEq/L (ref 96–112)
Creatinine, Ser: 0.62 mg/dL (ref 0.50–1.10)
Glucose, Bld: 91 mg/dL (ref 70–99)

## 2012-07-17 MED ORDER — KETOROLAC TROMETHAMINE 15 MG/ML IJ SOLN
15.0000 mg | Freq: Once | INTRAMUSCULAR | Status: AC
  Administered 2012-07-17: 15 mg via INTRAVENOUS
  Filled 2012-07-17: qty 1

## 2012-07-17 MED ORDER — IOHEXOL 300 MG/ML  SOLN
100.0000 mL | Freq: Once | INTRAMUSCULAR | Status: AC | PRN
  Administered 2012-07-17: 100 mL via INTRAVENOUS

## 2012-07-17 MED ORDER — SODIUM CHLORIDE 0.9 % IV BOLUS (SEPSIS)
500.0000 mL | Freq: Once | INTRAVENOUS | Status: AC
  Administered 2012-07-17: 500 mL via INTRAVENOUS

## 2012-07-17 MED ORDER — HYDROMORPHONE HCL PF 1 MG/ML IJ SOLN
1.0000 mg | Freq: Once | INTRAMUSCULAR | Status: AC
  Administered 2012-07-17: 1 mg via INTRAVENOUS
  Filled 2012-07-17: qty 1

## 2012-07-17 NOTE — ED Notes (Signed)
Pt is here with lower abdominal pain and radiates around to back that started this am about 1030.  Pt reports some nausea.  Pt states she had diarrhea for about 3 months and was given flagyl and then it restarted again 2 days ago

## 2012-07-17 NOTE — ED Notes (Signed)
Patient transported to CT 

## 2012-07-17 NOTE — ED Notes (Signed)
Pt reports being at [facility] prior and blood work and urine sample given

## 2012-07-17 NOTE — Telephone Encounter (Signed)
Received following message from Clay at front desk via staff message:  "Just an FYI: Pt's husband called this morning to see if she could be seen earlier today with Dr. Derrell Lolling because her lower abdomen and back hurt. Her visit is for a yearly breast check. They were planning to go to the ED, but thought Dr. Derrell Lolling could see her sooner and check both. I advised they go to the hospital."

## 2012-07-17 NOTE — ED Notes (Signed)
Pt presents with NAD_ Pt reports was at [facility] and lab work was done yet was unable to get a bed for evaluation here for evaluation of the same presentation as at [facility].

## 2012-07-17 NOTE — ED Notes (Signed)
Pt reports took percocet at 1230 with some relief

## 2012-07-17 NOTE — ED Provider Notes (Signed)
History     CSN: [id number]  Arrival date & time 07/17/12  1703   First MD Initiated Contact with Patient 07/17/12 1917      Chief Complaint  Patient presents with  . Abdominal Pain  . Back Pain    (Consider location/radiation/quality/duration/timing/severity/associated sxs/prior treatment) HPIPat [name] is a 66 y.o. female says she was walking around a furniture exposed today when she had the sudden onset of lower abdominal pain located in the pelvis in she says it also radiates around to the left side of her back. She has a history of stones but says this does not feel at this time. She reported some nausea as well. Patient went to another emergency department, and says she could not wait any longer so she came to this emergency department. Labs were obtained and a urinalysis was also obtained showing a large amount of blood.  She relates a recent history of having had diarrhea for 3 months, being treated with Flagyl for a "parasite" in her diarrhea restarted 2 days ago.  Patient did try some Percocet at about 12:30 with some relief. Currently her pain is 7/10, constant, and nonradiating.   Past Medical History  Diagnosis Date  . Thoracic aortic aneurysm     Dr. Dorris Fetch  . Breast cancer     s/p bilateral mastectomies  . Hiatal hernia   . HTN (hypertension)     Past Surgical History  Procedure Date  . Mastectomy     bilateral  . Gastric fundoplication   . Breast reconstruction     bilateral & implants  . Abdominal hysterectomy 1984  . Cesarean section 1977  . Hemorroidectomy     Family History  Problem Relation Age of Onset  . Diabetes Mother   . Heart disease Father   . Hyperlipidemia Sister   . Diabetes Sister   . Diabetes Sister   . Hyperlipidemia Sister     History  Substance Use Topics  . Smoking status: Never Smoker   . Smokeless tobacco: Not on file  . Alcohol Use: No    OB History    Grav Para Term Preterm Abortions TAB SAB Ect Mult Living                    Review of Systems At least 10pt or greater review of systems completed and are negative except where specified in the HPI.  Allergies  Ceclor and Sulfa antibiotics  Home Medications   Current Outpatient Rx  Name Route Sig Dispense Refill  . ALPRAZOLAM 0.5 MG PO TABS Oral Take 0.5 mg by mouth at bedtime as needed. For sleep    . AMLODIPINE BESY-BENAZEPRIL HCL 5-10 MG PO CAPS Oral Take 1 capsule by mouth daily.    Marland Kitchen CALCIUM CARBONATE-VITAMIN D 600-200 MG-UNIT PO TABS Oral Take 1 tablet by mouth daily.      Marland Kitchen VITAMIN D 2000 UNITS PO TABS Oral Take 2,000 Units by mouth daily.    Marland Kitchen EXEMESTANE 25 MG PO TABS Oral Take 25 mg by mouth daily after breakfast.      BP 115/72  Pulse 73  Temp 97.8 F (36.6 C) (Oral)  Resp 18  Ht 5' 2.5" (1.588 m)  Wt 135 lb (61.236 kg)  BMI 24.30 kg/m2  SpO2 100%  Physical Exam  Nursing notes reviewed.  Electronic medical record reviewed. VITAL SIGNS:   Filed Vitals:   07/17/12 1713 07/17/12 2359  BP: 115/72 127/79  Pulse: 73 60  Temp:  97.8 F (36.6 C) 97.7 F (36.5 C)  TempSrc: Oral Oral  Resp: 18 18  Height: 5' 2.5" (1.588 m)   Weight: 135 lb (61.236 kg)   SpO2: 100% 96%   CONSTITUTIONAL: Awake, oriented, appears non-toxic HENT: Atraumatic, normocephalic, oral mucosa pink and moist, airway patent. Nares patent without drainage. External ears normal. EYES: Conjunctiva clear, EOMI, PERRLA NECK: Trachea midline, non-tender, supple CARDIOVASCULAR: Normal heart rate, Normal rhythm, No murmurs, rubs, gallops PULMONARY/CHEST: Clear to auscultation, no rhonchi, wheezes, or rales. Symmetrical breath sounds. Non-tender. ABDOMINAL: Non-distended, soft, non-tender - no rebound or guarding.  BS normal. Mild left-sided CVA tenderness to percussion NEUROLOGIC: Non-focal, moving all four extremities, no gross sensory or motor deficits. EXTREMITIES: No clubbing, cyanosis, or edema SKIN: Warm, Dry, No erythema, No rash  ED Course   Procedures (including critical care time)   Ct Abdomen Pelvis W Contrast  07/17/2012  *RADIOLOGY REPORT*  Clinical Data: Radiating to the back beginning this evening.  Some nausea.  CT ABDOMEN AND PELVIS WITH CONTRAST  Technique:  Multidetector CT imaging of the abdomen and pelvis was performed following the standard protocol during bolus administration of intravenous contrast.  Contrast: OMNIPAQUE IOHEXOL 300 MG/ML  SOLN  Comparison: 06/29/2012  Findings: There is minimal subsegmental atelectasis at the lung bases.  The heart is normal in size.  There are surgical vascular clips in the epigastric region consistent with a previous fundoplication.  A 4 cm angioma is stable arising from the inferior margin of the posterior segment of the right lobe of the liver.  There is mild intrahepatic bile duct dilation, chronic.  The liver is otherwise unremarkable.  Normal spleen.  The gallbladder surgically absent. The common bile duct is dilated to 1 cm and shows relatively abrupt tapering in the pancreatic head.  This is a stable chronic finding. The pancreas is unremarkable.  No adrenal masses.  Left renal cyst is stable.  There are nonobstructing stones in the lower pole of the left kidney.  There are nonobstructing stones in the right kidney.  There is a eighth 4 mm stone in the proximal left ureter and mild dilation of the left intrarenal collecting system.  This is new.  No right hydronephrosis.  The ureters are otherwise unremarkable.  Normal bladder.  The uterus is surgically absent.  No pelvic masses.  There are sigmoid colon diverticula with a few  descending colon diverticula.  No diverticulitis.  Bowel is otherwise unremarkable. Normal appendix is not visualized but no evidence of appendicitis. There is no significant bony abnormality.  No adenopathy and no abnormal fluid collections.  IMPRESSION: 4 mm stone at the left ureteral pelvic junction causes mild left hydronephrosis.  Bilateral  nonobstructing intrarenal stones.  Stable left renal cyst.  Other stable findings are also noted including a liver hemangioma, changes from cholecystectomy and chronic common bile duct and intrahepatic bile duct dilation as well as sigmoid diverticulosis and changes from a hysterectomy.   Original Report Authenticated By: Domenic Moras, M.D.      1. Ureterolithiasis   2. Nephrolithiasis   3. Ureteral colic       MDM  [name] is a 66 y.o. female presenting with left lower quadrant pain, pelvic pain and back pain. This is doesn't typically feel like her normal stone pain - she was evaluated in [place]'s ER decided she did not want wait any longer unit she had a urinalysis in some labs drawn there. Labs there showed hematuria and normal  renal function, normal white cell count.  Patient arrives here suspect stone, she did have some stones seen on KUB via her urologist about a week ago. She is doing Zumba repeatedly and did this morning, her pain started after that. Obtain a CT of the abdomen and pelvis as her primary care physician was worried about possible bladder mass.  CT of the abdomen and pelvis shows a 4 mm stone at the left UPJ causing mild left hydronephrosis. She does have redemonstrated bilateral nonobstructing intrarenal stones. She also has diverticulosis - all these findings were relayed to the patient at bedside.  Patient will follow up with urology.  Discharge the patient home with appropriate medications to pass the stone including pain medicine and Flomax.  I explained the diagnosis and have given explicit precautions to return to the ER including fevers, chills, worsening pain any other new or worsening symptoms. The patient understands and accepts the medical plan as it's been dictated and I have answered their questions. Discharge instructions concerning home care and prescriptions have been given.  The patient is STABLE and is discharged to home in good  condition.          Jones Skene, MD 07/18/12 [phone]

## 2012-07-18 MED ORDER — IBUPROFEN 400 MG PO TABS: 400.0000 mg | ORAL_TABLET | Freq: Four times a day (QID) | ORAL | Status: DC | PRN

## 2012-07-18 MED ORDER — PROMETHAZINE HCL 25 MG PO TABS: 25.0000 mg | ORAL_TABLET | Freq: Four times a day (QID) | ORAL | Status: DC | PRN

## 2012-07-18 MED ORDER — OXYCODONE-ACETAMINOPHEN 5-325 MG PO TABS
2.0000 | ORAL_TABLET | Freq: Once | ORAL | Status: AC
  Administered 2012-07-18: 2 via ORAL
  Filled 2012-07-18: qty 2

## 2012-07-18 MED ORDER — ONDANSETRON HCL 4 MG/2ML IJ SOLN
INTRAMUSCULAR | Status: AC
  Administered 2012-07-18: 4 mg
  Filled 2012-07-18: qty 2

## 2012-07-18 MED ORDER — TAMSULOSIN HCL 0.4 MG PO CAPS: 0.4000 mg | ORAL_CAPSULE | Freq: Every day | ORAL | Status: DC

## 2012-07-18 MED ORDER — OXYCODONE-ACETAMINOPHEN 5-325 MG PO TABS: 1.0000 | ORAL_TABLET | Freq: Four times a day (QID) | ORAL | Status: DC | PRN

## 2012-07-19 ENCOUNTER — Encounter (HOSPITAL_COMMUNITY): Payer: Self-pay | Admitting: Emergency Medicine

## 2012-07-19 ENCOUNTER — Emergency Department (HOSPITAL_COMMUNITY): Payer: Medicare Other

## 2012-07-19 ENCOUNTER — Emergency Department (HOSPITAL_COMMUNITY)
Admission: EM | Admit: 2012-07-19 | Discharge: 2012-07-19 | Disposition: A | Discharge status: Home / Self Care | Payer: Medicare Other | Attending: Emergency Medicine | Admitting: Emergency Medicine

## 2012-07-19 DIAGNOSIS — N200 Calculus of kidney: Secondary | ICD-10-CM | POA: Diagnosis not present

## 2012-07-19 DIAGNOSIS — Z8719 Personal history of other diseases of the digestive system: Secondary | ICD-10-CM | POA: Insufficient documentation

## 2012-07-19 DIAGNOSIS — Z853 Personal history of malignant neoplasm of breast: Secondary | ICD-10-CM | POA: Diagnosis not present

## 2012-07-19 DIAGNOSIS — Z8679 Personal history of other diseases of the circulatory system: Secondary | ICD-10-CM | POA: Diagnosis not present

## 2012-07-19 DIAGNOSIS — N23 Unspecified renal colic: Secondary | ICD-10-CM | POA: Diagnosis not present

## 2012-07-19 DIAGNOSIS — R110 Nausea: Secondary | ICD-10-CM | POA: Diagnosis not present

## 2012-07-19 DIAGNOSIS — Z79899 Other long term (current) drug therapy: Secondary | ICD-10-CM | POA: Diagnosis not present

## 2012-07-19 DIAGNOSIS — I10 Essential (primary) hypertension: Secondary | ICD-10-CM | POA: Diagnosis not present

## 2012-07-19 LAB — URINALYSIS, ROUTINE W REFLEX MICROSCOPIC
Bilirubin Urine: NEGATIVE
Ketones, ur: NEGATIVE mg/dL
Nitrite: NEGATIVE
Specific Gravity, Urine: 1.013 (ref 1.005–1.030)
Urobilinogen, UA: 0.2 mg/dL (ref 0.0–1.0)

## 2012-07-19 LAB — URINE MICROSCOPIC-ADD ON

## 2012-07-19 MED ORDER — ONDANSETRON HCL 4 MG/2ML IJ SOLN
4.0000 mg | Freq: Once | INTRAMUSCULAR | Status: AC
  Administered 2012-07-19: 4 mg via INTRAVENOUS
  Filled 2012-07-19: qty 2

## 2012-07-19 MED ORDER — MORPHINE SULFATE 4 MG/ML IJ SOLN
6.0000 mg | Freq: Once | INTRAMUSCULAR | Status: AC
  Administered 2012-07-19: 6 mg via INTRAVENOUS
  Filled 2012-07-19: qty 2

## 2012-07-19 MED ORDER — OXYCODONE-ACETAMINOPHEN 10-325 MG PO TABS: 1.0000 | ORAL_TABLET | ORAL | Status: DC | PRN

## 2012-07-19 MED ORDER — SODIUM CHLORIDE 0.9 % IV SOLN
Freq: Once | INTRAVENOUS | Status: AC
  Administered 2012-07-19: 07:00:00 via INTRAVENOUS

## 2012-07-19 NOTE — ED Notes (Signed)
Pt presents to the ED with a complaint of flank pain. Pt was here on Tuesday and has a diagnosis of kidney stones.  Flank pain began on Tuesday.  Pt awoke today with pain, has taken "2 percocet and a phenigran"  Pt appears in pain but otherwise stable.

## 2012-07-19 NOTE — ED Provider Notes (Signed)
History     CSN: [id number]  Arrival date & time 07/19/12  4098   First MD Initiated Contact with Patient 07/19/12 [phone]      Chief Complaint  Patient presents with  . Flank Pain    (Consider location/radiation/quality/duration/timing/severity/associated sxs/prior treatment) HPI Comments: Patient with a history of kidney stones presents with flank pain consistent with her renal colic. Patient was seen here 2 days ago and had a CT scan showing a 4 mm stone in the left UPJ with some mild hydronephrosis she states her pain did okay yesterday and then came back more intensely this morning. She took 2 Percocet at home which did not control the pain and presented here with worsening pain. She's had some nausea but no vomiting. Denies any fevers or chills. Denies any urinary difficulties. She is followed by a urologist at [facility] urology.  Patient is a 66 y.o. female presenting with flank pain. The history is provided by the patient.  Flank Pain This is a recurrent problem. Pertinent negatives include no chest pain, no abdominal pain, no headaches and no shortness of breath.    Past Medical History  Diagnosis Date  . Thoracic aortic aneurysm     Dr. Dorris Fetch  . Breast cancer     s/p bilateral mastectomies  . Hiatal hernia   . HTN (hypertension)     Past Surgical History  Procedure Date  . Mastectomy     bilateral  . Gastric fundoplication   . Breast reconstruction     bilateral & implants  . Abdominal hysterectomy 1984  . Cesarean section 1977  . Hemorroidectomy     Family History  Problem Relation Age of Onset  . Diabetes Mother   . Heart disease Father   . Hyperlipidemia Sister   . Diabetes Sister   . Diabetes Sister   . Hyperlipidemia Sister     History  Substance Use Topics  . Smoking status: Never Smoker   . Smokeless tobacco: Not on file  . Alcohol Use: No    OB History    Grav Para Term Preterm Abortions TAB SAB Ect Mult Living                   Review of Systems  Constitutional: Negative for fever, chills, diaphoresis and fatigue.  HENT: Negative for congestion, rhinorrhea and sneezing.   Eyes: Negative.   Respiratory: Negative for cough, chest tightness and shortness of breath.   Cardiovascular: Negative for chest pain and leg swelling.  Gastrointestinal: Positive for nausea. Negative for vomiting, abdominal pain, diarrhea and blood in stool.  Genitourinary: Positive for flank pain. Negative for frequency, hematuria and difficulty urinating.  Musculoskeletal: Negative for back pain and arthralgias.  Skin: Negative for rash.  Neurological: Negative for dizziness, speech difficulty, weakness, numbness and headaches.    Allergies  Ceclor and Sulfa antibiotics  Home Medications   Current Outpatient Rx  Name Route Sig Dispense Refill  . ALPRAZOLAM 0.5 MG PO TABS Oral Take 0.5 mg by mouth at bedtime as needed. For sleep    . AMLODIPINE BESY-BENAZEPRIL HCL 5-10 MG PO CAPS Oral Take 1 capsule by mouth daily.    Marland Kitchen CALCIUM CARBONATE-VITAMIN D 600-200 MG-UNIT PO TABS Oral Take 1 tablet by mouth daily.      Marland Kitchen VITAMIN D 2000 UNITS PO TABS Oral Take 2,000 Units by mouth daily.    Marland Kitchen EXEMESTANE 25 MG PO TABS Oral Take 25 mg by mouth daily after breakfast.    .  IBUPROFEN 400 MG PO TABS Oral Take 1 tablet (400 mg total) by mouth every 6 (six) hours as needed for pain. 30 tablet 0  . OXYCODONE-ACETAMINOPHEN 5-325 MG PO TABS Oral Take 1-2 tablets by mouth every 6 (six) hours as needed for pain. 23 tablet 0  . PROMETHAZINE HCL 25 MG PO TABS Oral Take 1 tablet (25 mg total) by mouth every 6 (six) hours as needed for nausea. 30 tablet 0  . TAMSULOSIN HCL 0.4 MG PO CAPS Oral Take 1 capsule (0.4 mg total) by mouth daily. 15 capsule 0  . OXYCODONE-ACETAMINOPHEN 10-325 MG PO TABS Oral Take 1 tablet by mouth every 4 (four) hours as needed for pain. 20 tablet 0    BP 130/76  Pulse 65  Temp 98 F (36.7 C) (Oral)  Resp 20  SpO2 98%  Physical  Exam  Constitutional: She is oriented to person, place, and time. She appears well-developed and well-nourished.  HENT:  Head: Normocephalic and atraumatic.  Eyes: Pupils are equal, round, and reactive to light.  Neck: Normal range of motion. Neck supple.  Cardiovascular: Normal rate, regular rhythm and normal heart sounds.   Pulmonary/Chest: Effort normal and breath sounds normal. No respiratory distress. She has no wheezes. She has no rales. She exhibits no tenderness.  Abdominal: Soft. Bowel sounds are normal. There is tenderness (Mild tenderness the left flank). There is no rebound and no guarding.  Musculoskeletal: Normal range of motion. She exhibits no edema.  Lymphadenopathy:    She has no cervical adenopathy.  Neurological: She is alert and oriented to person, place, and time.  Skin: Skin is warm and dry. No rash noted.  Psychiatric: She has a normal mood and affect.    ED Course  Procedures (including critical care time)   Results for orders placed during the hospital encounter of 07/19/12  URINALYSIS, ROUTINE W REFLEX MICROSCOPIC      Component Value Range   Color, Urine YELLOW  YELLOW   APPearance CLEAR  CLEAR   Specific Gravity, Urine 1.013  1.005 - 1.030   pH 5.5  5.0 - 8.0   Glucose, UA NEGATIVE  NEGATIVE mg/dL   Hgb urine dipstick MODERATE (*) NEGATIVE   Bilirubin Urine NEGATIVE  NEGATIVE   Ketones, ur NEGATIVE  NEGATIVE mg/dL   Protein, ur NEGATIVE  NEGATIVE mg/dL   Urobilinogen, UA 0.2  0.0 - 1.0 mg/dL   Nitrite NEGATIVE  NEGATIVE   Leukocytes, UA NEGATIVE  NEGATIVE  URINE MICROSCOPIC-ADD ON      Component Value Range   Squamous Epithelial / LPF RARE  RARE   WBC, UA 0-2  <3 WBC/hpf   RBC / HPF 3-6  <3 RBC/hpf   Bacteria, UA RARE  RARE   Dg Abd 1 View  07/19/2012  *RADIOLOGY REPORT*  Clinical Data: Bilateral flank pain.  Greater on the left.  History kidney stones.  ABDOMEN - 1 VIEW  Comparison: CT of 07/17/2012  Findings: Contrast within the colon.   There is a inter/lower pole left renal calculus.  A calcification in the left side of the abdomen measures 5 mm and projects at a  level just below the left transverse process of L3.  This likely represents mild inferior progression of the left sided calculus.  There is a lower pole right renal calculus. Non-obstructive bowel gas pattern.  Extensive phleboliths in the pelvis.  Cholecystectomy clips.  IMPRESSION: 5 mm calcification likely represents interval movement of the left sided stone into the mid-left ureter.  Bilateral renal calculi.   Original Report Authenticated By: Consuello Bossier, M.D.    Ct Abdomen Pelvis W Contrast  07/17/2012  *RADIOLOGY REPORT*  Clinical Data: Radiating to the back beginning this evening.  Some nausea.  CT ABDOMEN AND PELVIS WITH CONTRAST  Technique:  Multidetector CT imaging of the abdomen and pelvis was performed following the standard protocol during bolus administration of intravenous contrast.  Contrast: OMNIPAQUE IOHEXOL 300 MG/ML  SOLN  Comparison: 06/29/2012  Findings: There is minimal subsegmental atelectasis at the lung bases.  The heart is normal in size.  There are surgical vascular clips in the epigastric region consistent with a previous fundoplication.  A 4 cm angioma is stable arising from the inferior margin of the posterior segment of the right lobe of the liver.  There is mild intrahepatic bile duct dilation, chronic.  The liver is otherwise unremarkable.  Normal spleen.  The gallbladder surgically absent. The common bile duct is dilated to 1 cm and shows relatively abrupt tapering in the pancreatic head.  This is a stable chronic finding. The pancreas is unremarkable.  No adrenal masses.  Left renal cyst is stable.  There are nonobstructing stones in the lower pole of the left kidney.  There are nonobstructing stones in the right kidney.  There is a eighth 4 mm stone in the proximal left ureter and mild dilation of the left intrarenal collecting system.   This is new.  No right hydronephrosis.  The ureters are otherwise unremarkable.  Normal bladder.  The uterus is surgically absent.  No pelvic masses.  There are sigmoid colon diverticula with a few  descending colon diverticula.  No diverticulitis.  Bowel is otherwise unremarkable. Normal appendix is not visualized but no evidence of appendicitis. There is no significant bony abnormality.  No adenopathy and no abnormal fluid collections.  IMPRESSION: 4 mm stone at the left ureteral pelvic junction causes mild left hydronephrosis.  Bilateral nonobstructing intrarenal stones.  Stable left renal cyst.  Other stable findings are also noted including a liver hemangioma, changes from cholecystectomy and chronic common bile duct and intrahepatic bile duct dilation as well as sigmoid diverticulosis and changes from a hysterectomy.   Original Report Authenticated By: Domenic Moras, M.D.      1. Renal colic       MDM  Pt with 4-30mm proximal left ureteral stone.  Appears to have moved since CT 2 days ago.  Pt pain controlled in ED.  No vomiting.  No evidence of infection.  Pt does not want intervention for stone at this point, wants to go home.  Requests stronger pain meds.  Given rx for percocet 10mg  tabs.  Notified Dr Brunilda Payor that pt will call for f/u appt in office        Rolan Bucco, MD 07/19/12 [phone]

## 2012-07-25 DIAGNOSIS — N200 Calculus of kidney: Secondary | ICD-10-CM | POA: Diagnosis not present

## 2012-07-25 DIAGNOSIS — I10 Essential (primary) hypertension: Secondary | ICD-10-CM | POA: Diagnosis not present

## 2012-07-26 ENCOUNTER — Encounter (HOSPITAL_COMMUNITY): Payer: Self-pay | Admitting: *Deleted

## 2012-07-26 ENCOUNTER — Ambulatory Visit (HOSPITAL_COMMUNITY): Payer: Medicare Other | Admitting: Anesthesiology

## 2012-07-26 ENCOUNTER — Ambulatory Visit (HOSPITAL_COMMUNITY)
Admission: AD | Admit: 2012-07-26 | Discharge: 2012-07-26 | Disposition: A | Discharge status: Home / Self Care | Payer: Medicare Other | Source: Ambulatory Visit | Attending: Urology | Admitting: Urology

## 2012-07-26 ENCOUNTER — Encounter (HOSPITAL_COMMUNITY)
Admission: AD | Disposition: A | Discharge status: Home / Self Care | Payer: Self-pay | Source: Ambulatory Visit | Attending: Urology

## 2012-07-26 ENCOUNTER — Encounter (HOSPITAL_COMMUNITY): Payer: Self-pay | Admitting: Anesthesiology

## 2012-07-26 ENCOUNTER — Other Ambulatory Visit: Payer: Self-pay | Admitting: Urology

## 2012-07-26 DIAGNOSIS — Z79899 Other long term (current) drug therapy: Secondary | ICD-10-CM | POA: Insufficient documentation

## 2012-07-26 DIAGNOSIS — R3989 Other symptoms and signs involving the genitourinary system: Secondary | ICD-10-CM | POA: Diagnosis not present

## 2012-07-26 DIAGNOSIS — Z853 Personal history of malignant neoplasm of breast: Secondary | ICD-10-CM | POA: Insufficient documentation

## 2012-07-26 DIAGNOSIS — N201 Calculus of ureter: Secondary | ICD-10-CM | POA: Diagnosis not present

## 2012-07-26 DIAGNOSIS — N200 Calculus of kidney: Secondary | ICD-10-CM | POA: Insufficient documentation

## 2012-07-26 DIAGNOSIS — C50919 Malignant neoplasm of unspecified site of unspecified female breast: Secondary | ICD-10-CM | POA: Diagnosis not present

## 2012-07-26 DIAGNOSIS — I10 Essential (primary) hypertension: Secondary | ICD-10-CM | POA: Insufficient documentation

## 2012-07-26 DIAGNOSIS — N1330 Unspecified hydronephrosis: Secondary | ICD-10-CM | POA: Insufficient documentation

## 2012-07-26 DIAGNOSIS — K219 Gastro-esophageal reflux disease without esophagitis: Secondary | ICD-10-CM | POA: Diagnosis not present

## 2012-07-26 HISTORY — PX: URETEROSCOPY: SHX842

## 2012-07-26 HISTORY — PX: STONE EXTRACTION WITH BASKET: SHX5318

## 2012-07-26 HISTORY — PX: CYSTOSCOPY W/ URETERAL STENT PLACEMENT: SHX1429

## 2012-07-26 LAB — SURGICAL PCR SCREEN
MRSA, PCR: NEGATIVE
Staphylococcus aureus: NEGATIVE

## 2012-07-26 SURGERY — CYSTOSCOPY, WITH RETROGRADE PYELOGRAM AND URETERAL STENT INSERTION
Anesthesia: General | Site: Ureter | Laterality: Left

## 2012-07-26 MED ORDER — PROPOFOL 10 MG/ML IV BOLUS
INTRAVENOUS | Status: DC | PRN
  Administered 2012-07-26: 150 mg via INTRAVENOUS
  Administered 2012-07-26: 50 mg via INTRAVENOUS

## 2012-07-26 MED ORDER — DEXAMETHASONE SODIUM PHOSPHATE 10 MG/ML IJ SOLN
INTRAMUSCULAR | Status: DC | PRN
  Administered 2012-07-26: 10 mg via INTRAVENOUS

## 2012-07-26 MED ORDER — LIDOCAINE HCL (CARDIAC) 20 MG/ML IV SOLN
INTRAVENOUS | Status: DC | PRN
  Administered 2012-07-26: 50 mg via INTRAVENOUS

## 2012-07-26 MED ORDER — SUCCINYLCHOLINE CHLORIDE 20 MG/ML IJ SOLN
INTRAMUSCULAR | Status: DC | PRN
  Administered 2012-07-26: 100 mg via INTRAVENOUS

## 2012-07-26 MED ORDER — PHENAZOPYRIDINE HCL 200 MG PO TABS: 200.0000 mg | ORAL_TABLET | Freq: Three times a day (TID) | ORAL | Status: DC

## 2012-07-26 MED ORDER — ACETAMINOPHEN 10 MG/ML IV SOLN
INTRAVENOUS | Status: DC | PRN
  Administered 2012-07-26: 1000 mg via INTRAVENOUS

## 2012-07-26 MED ORDER — CIPROFLOXACIN IN D5W 400 MG/200ML IV SOLN
INTRAVENOUS | Status: AC
  Filled 2012-07-26: qty 200

## 2012-07-26 MED ORDER — LACTATED RINGERS IV SOLN: INTRAVENOUS | Status: DC

## 2012-07-26 MED ORDER — IOHEXOL 300 MG/ML  SOLN
INTRAMUSCULAR | Status: DC | PRN
  Administered 2012-07-26: 7 mL

## 2012-07-26 MED ORDER — PHENAZOPYRIDINE HCL 200 MG PO TABS: 200.0000 mg | ORAL_TABLET | Freq: Three times a day (TID) | ORAL | Status: DC | PRN

## 2012-07-26 MED ORDER — HYDROMORPHONE HCL PF 1 MG/ML IJ SOLN: 0.2500 mg | INTRAMUSCULAR | Status: DC | PRN

## 2012-07-26 MED ORDER — CIPROFLOXACIN IN D5W 400 MG/200ML IV SOLN
400.0000 mg | INTRAVENOUS | Status: AC
  Administered 2012-07-26: 400 mg via INTRAVENOUS

## 2012-07-26 MED ORDER — PROMETHAZINE HCL 25 MG/ML IJ SOLN: 6.2500 mg | INTRAMUSCULAR | Status: DC | PRN

## 2012-07-26 MED ORDER — FENTANYL CITRATE 0.05 MG/ML IJ SOLN
INTRAMUSCULAR | Status: DC | PRN
  Administered 2012-07-26 (×2): 100 ug via INTRAVENOUS
  Administered 2012-07-26: 50 ug via INTRAVENOUS

## 2012-07-26 MED ORDER — MUPIROCIN 2 % EX OINT
TOPICAL_OINTMENT | Freq: Two times a day (BID) | CUTANEOUS | Status: DC
  Administered 2012-07-26: 1 via NASAL
  Filled 2012-07-26: qty 22

## 2012-07-26 MED ORDER — IOHEXOL 300 MG/ML  SOLN
INTRAMUSCULAR | Status: AC
  Filled 2012-07-26: qty 1

## 2012-07-26 MED ORDER — LACTATED RINGERS IV SOLN
INTRAVENOUS | Status: DC
  Administered 2012-07-26: 17:00:00 via INTRAVENOUS

## 2012-07-26 MED ORDER — ONDANSETRON HCL 4 MG/2ML IJ SOLN
INTRAMUSCULAR | Status: DC | PRN
  Administered 2012-07-26: 4 mg via INTRAVENOUS

## 2012-07-26 MED ORDER — ACETAMINOPHEN 10 MG/ML IV SOLN
INTRAVENOUS | Status: AC
  Filled 2012-07-26: qty 100

## 2012-07-26 SURGICAL SUPPLY — 17 items
ADAPTER CATH URET PLST 4-6FR (CATHETERS) ×3 IMPLANT
ADPR CATH URET STRL DISP 4-6FR (CATHETERS) ×2
BAG URO CATCHER STRL LF (DRAPE) ×3 IMPLANT
BASKET ZERO TIP NITINOL 2.4FR (BASKET) ×1 IMPLANT
BSKT STON RTRVL ZERO TP 2.4FR (BASKET) ×2
CATH URET 5FR 28IN OPEN ENDED (CATHETERS) ×3 IMPLANT
CLOTH BEACON ORANGE TIMEOUT ST (SAFETY) ×3 IMPLANT
DRAPE CAMERA CLOSED 9X96 (DRAPES) ×3 IMPLANT
GLOVE BIOGEL M 8.0 STRL (GLOVE) ×3 IMPLANT
GLOVE SURG SS PI 8.0 STRL IVOR (GLOVE) ×3 IMPLANT
GOWN PREVENTION PLUS XLARGE (GOWN DISPOSABLE) ×3 IMPLANT
GOWN STRL REIN XL XLG (GOWN DISPOSABLE) ×4 IMPLANT
GUIDEWIRE STR DUAL SENSOR (WIRE) ×3 IMPLANT
MANIFOLD NEPTUNE II (INSTRUMENTS) ×3 IMPLANT
MARKER SKIN DUAL TIP RULER LAB (MISCELLANEOUS) ×3 IMPLANT
PACK CYSTO (CUSTOM PROCEDURE TRAY) ×3 IMPLANT
TUBING CONNECTING 10 (TUBING) ×3 IMPLANT

## 2012-07-26 NOTE — Anesthesia Postprocedure Evaluation (Signed)
Anesthesia Post Note  Patient: [name]  Procedure(s) Performed: Procedure(s) (LRB): CYSTOSCOPY WITH RETROGRADE PYELOGRAM/URETERAL STENT PLACEMENT (Left) STONE EXTRACTION WITH BASKET (Left) URETEROSCOPY (Left)  Anesthesia type: General  Patient location: PACU  Post pain: Pain level controlled  Post assessment: Post-op Vital signs reviewed  Last Vitals:  Filed Vitals:   07/26/12 1838  BP:   Pulse: 65  Temp:   Resp: 17    Post vital signs: Reviewed  Level of consciousness: sedated  Complications: No apparent anesthesia complications

## 2012-07-26 NOTE — Transfer of Care (Signed)
Immediate Anesthesia Transfer of Care Note  Patient: [name]  Procedure(s) Performed: Procedure(s) (LRB) with comments: CYSTOSCOPY WITH RETROGRADE PYELOGRAM/URETERAL STENT PLACEMENT (Left) STONE EXTRACTION WITH BASKET (Left) URETEROSCOPY (Left)  Patient Location: PACU  Anesthesia Type:General  Level of Consciousness: awake, alert  and patient cooperative  Airway & Oxygen Therapy: Patient Spontanous Breathing and Patient connected to face mask oxygen  Post-op Assessment: Report given to PACU RN, Post -op Vital signs reviewed and stable and Patient moving all extremities X 4  Post vital signs: Reviewed and stable  Complications: No apparent anesthesia complications

## 2012-07-26 NOTE — H&P (Signed)
History of Present Illness        66 YO female patient of Dr. Margrett Rud seen today as a work-in for complaint of pelvic/bladder pain, urgency and frequency.   GU HX:      Nephrolithiasis: She has a history of nephrolithiasis and underwent lithotripsy in 6/95 and again in 3/02. A CT scan in 7/11 revealed small, nonobstructing, lower pole renal calculi bilaterally. Stone analysis: Calcium oxalate  Renal cysts: A CT scan done in 3/06 revealed bilateral renal cysts. In 7/11 a CT scan revealed left renal cysts only.  Microscopic hematuria: She was found to have microscopic hematuria at the time of her routine physical exam in 9/12 area and she was in to see her primary care physician for a physical again this year and microscopic hematuria was again noted.  Interval history:   Has been seen X 3 in ER's since inital visit 06/29/12 with last ER visit yesterday in [place]. Seen in ER 07/17/12 and 07/19/12. CT and KUB showed 1-2 left ureteral calculuis within ureter with mild left hydronephrosis.   Today states severe urgency and frequency and bladder pressure. No f/c or n/v but has had "bloody mucus from rectum"   Past Medical History Problems  1. History of  Abdominal Pain In The Left Upper Belly (LUQ) 789.02 2. History of  Breast Cancer V10.3 3. History of  Esophageal Reflux 530.81 4. History of  Hypertension 401.9 5. History of  Microscopic Hematuria 599.72 6. History of  Nephrolithiasis V13.01 7. History of  Ureteral Stone 592.1  Surgical History Problems  1. History of  Arthroscopy Shoulder Left 2. History of  Breast Surgery Lumpectomy 3. History of  Breast Surgery Mastectomy V45.71 4. History of  Cesarean Section 5. History of  Gallbladder Surgery 6. History of  Hemorrhoidectomy 7. History of  Hysterectomy 8. History of  Lithotripsy  Current Meds 1. Calcium TABS; Therapy: (Recorded:12Jul2011) to 2. Femara TABS; Therapy: (Recorded:12Jul2011) to 3. Lotrel 5-10 MG Oral Capsule;  Therapy: (Recorded:22Feb2008) to 4. Nystatin-Triamcinolone 100000-0.1 UNIT/GM-% External Cream; apply sm amt sparingly TID but  no more than 10 days; Therapy: 12Jul2011 to (Last Rx:12Jul2011) 5. Xanax TABS; Therapy: (Recorded:22Feb2008) to  Allergies Medication  1. Ceclor CAPS 2. Sulfa Drugs  Family History Problems  1. Sororal history of  Bladder Cancer V16.59 2. Family history of  Death In The Family Father 50- stroke 3. Maternal history of  Death In The Family Mother 11- emphysema 4. Maternal history of  Emphysema 5. Family history of  Family Health Status Number Of Children 1 son 6. Maternal history of  Ischemic Stroke V17.1 7. Son's history of  Nephrolithiasis 8. Fraternal history of  Prostate Cancer V16.42 9. Paternal history of  Stroke Syndrome V17.1 10. Family history of  Urologic Disorder V18.7 kidney stones  Social History Problems  1. Marital History - Currently Married 2. 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 Caffeine Use 5. Tobacco Use  Review of Systems Genitourinary, constitutional, skin, eye, otolaryngeal, hematologic/lymphatic, cardiovascular, pulmonary, endocrine, musculoskeletal, gastrointestinal, neurological and psychiatric system(s) were reviewed and pertinent findings if present are noted.  Genitourinary: urinary frequency, urinary urgency, dysuria, urinary hesitancy, pelvic pain, suprapubic pain and initiating urination requires straining.  Gastrointestinal: (bloody mucus).    Vitals Vital Signs [Data Includes: Last 1 Day]  31Oct2013 02:50PM  Blood Pressure: 138 / 94 Temperature: 98.9 F Heart Rate: 86  Physical Exam Constitutional: Well nourished and well developed . In acute distress. The patient appears well hydrated.  ENT:.  The ears and nose are normal in appearance.  Neck: The appearance of the neck is normal.  Pulmonary: No respiratory distress.  Cardiovascular: Heart rate and rhythm are normal.  Abdomen: The abdomen is  flat. The abdomen is soft and nontender. Moderate suprapubic tenderness is present. No CVA tenderness.  Skin: Normal skin turgor and normal skin color and pigmentation.  Neuro/Psych:. Mood and affect are appropriate.    Results/Data Urine [Data Includes: Last 1 Day]   31Oct2013  COLOR YELLOW   APPEARANCE CLEAR   SPECIFIC GRAVITY 1.025   pH 6.0   GLUCOSE NEG mg/dL  BILIRUBIN NEG   KETONE NEG mg/dL  BLOOD SMALL   PROTEIN NEG mg/dL  UROBILINOGEN 0.2 mg/dL  NITRITE NEG   LEUKOCYTE ESTERASE NEG   SQUAMOUS EPITHELIAL/HPF FEW   WBC 0-2 WBC/hpf  RBC 3-6 RBC/hpf  BACTERIA NONE SEEN   CRYSTALS Calcium Oxalate crystals noted   CASTS NONE SEEN    The following clinical lab reports were reviewed:  UA.  PVR: Ultrasound PVR 7 ml.    Assessment Assessed  1. Ureteral Stone Left 592.1 2. Bladder Pain 788.99 3. Hydronephrosis On The Left 591 4. Nephrolithiasis Of Both Kidneys 592.0   Dr. Vernie Ammons has reviewed all CT scans and KUB. With pt in so much discomfort and has been seen now X 3 in ER for recurrent sxs will proceed with cystoscopy, possible ureterscopy, stone extraction, and possible Double J stent placment today. Risks and benefits discussed with both pt and husband which includes: bleeding, infection, and possible ureteral injury. All questions addressed and wishes to proceed.   Plan Health Maintenance (V70.0)  1. UA With REFLEX  Done: 31Oct2013 02:34PM   Urgent cystoscopy, possible ureterscopy, and stone extraction, and possible double J stent placement by Dr. Vernie Ammons Given Ketorolac 60 IM

## 2012-07-26 NOTE — Op Note (Signed)
PATIENT:  [name]  PRE-OPERATIVE DIAGNOSIS:  left Ureteral calculus  POST-OPERATIVE DIAGNOSIS: Same  PROCEDURE:  1. Cystoscopy with left retrograde pyelogram including interpretation. 2. Left ureteroscopy with stone extraction. 3. Left double-J stent placement  SURGEON: Garnett Farm, MD  INDICATION: Mr. [name] is a 66 year old female patient who has a known history of renal calculi. She developed renal colic and continued to have significant intermittent left flank pain. A CT scan revealed a proximal left ureteral stone and she also had what sounds like a distal left ureteral stone on a CT scan done in [place]. She continues to have pain and therefore brought to the operating room for ureteroscopic management.  ANESTHESIA:  General  EBL:  Minimal  DRAINS: 4.8 French, 24 cm double-J stent (with string)  SPECIMEN:  Stone  DISPOSITION OF SPECIMEN:  Given to patient  DESCRIPTION OF PROCEDURE: The patient was taken to the major OR and placed on the table. General anesthesia was administered and then the patient was moved to the dorsal lithotomy position. The genitalia was sterilely prepped and draped. An official timeout was performed.  Initially the 22 French cystoscope with 12 lens was passed under direct vision into the bladder. The bladder was then entered and fully inspected. It was noted be free of any tumors stones or inflammatory lesions. Ureteral orifices were of normal configuration and position. A 5 French open-ended ureteral catheter was then passed through the cystoscope into the ureteral orifice in order to perform a left retrograde pyelogram.  A retrograde pyelogram was performed by injecting full-strength contrast up the left ureter under direct fluoroscopic control. It revealed a filling defect in the distal lureter consistent with the stone. The remainder of the ureter was noted to be normal as was the intrarenal collecting system. I then passed a 0.038 inch  floppy-tipped guidewire through the open ended catheter and into the area of the renal pelvis and this was left in place. The inner portion of a ureteral access sheath was then passed over the guidewire to gently dilate the intramural ureter. I then proceeded with ureteroscopy.  A 6 French flexible, digital ureteroscope was then passed over the guidewire and into the left orifice and up the ureter. The stone was identified and I felt it could be easily extracted so I passed the Nitinol basket through the ureteroscope, engaged the stone and easily withdrew the scope and stone together. I then passed the ureteroscope back into the bladder, into the left orifice and up the left ureter inspecting it for any further stones and none were noted. I passed the scope all the way up into the renal pelvis which is noted be normal. I then passed a guidewire through the ureteroscope and backed the scope off of the guidewire leaving the guidewire in place. I then backloaded the cystoscope over the guidewire and passed the stent over the guidewire into the area of the renal pelvis. As the guidewire was removed good curl was noted in the renal pelvis. The bladder was drained and the cystoscope was then removed. The patient tolerated the procedure well no intraoperative complications.  PLAN OF CARE: Discharge to home after PACU  PATIENT DISPOSITION:  PACU - hemodynamically stable.

## 2012-07-26 NOTE — Anesthesia Preprocedure Evaluation (Addendum)
Anesthesia Evaluation  Patient identified by MRN, date of birth, ID band Patient awake    Reviewed: Allergy & Precautions, H&P , NPO status , Patient's Chart, lab work & pertinent test results, reviewed documented beta blocker date and time   History of Anesthesia Complications (+) PONVNegative for: history of anesthetic complications  Airway Mallampati: III TM Distance: >3 FB Neck ROM: Full    Dental  (+) Teeth Intact, Partial Upper and Dental Advisory Given   Pulmonary neg pulmonary ROS,  breath sounds clear to auscultation  Pulmonary exam normal       Cardiovascular Exercise Tolerance: Good hypertension, Pt. on medications Rhythm:Regular Rate:Normal     Neuro/Psych negative neurological ROS  negative psych ROS   GI/Hepatic negative GI ROS, Neg liver ROS, No GERD symtoms since fundiplication surgery 2002   Endo/Other  negative endocrine ROS  Renal/GU negative Renal ROS  negative genitourinary   Musculoskeletal negative musculoskeletal ROS (+)   Abdominal   Peds  Hematology negative hematology ROS (+)   Anesthesia Other Findings Ascending Thoracic Aneurysm approx 4 cm - patient being monitored by Dr. Dorris Fetch. Bilateral mastectomy secondary to CA.  Reproductive/Obstetrics negative OB ROS                         Anesthesia Physical Anesthesia Plan  ASA: III and Emergent  Anesthesia Plan:    Post-op Pain Management:    Induction: Intravenous, Rapid sequence and Cricoid pressure planned  Airway Management Planned: Oral ETT  Additional Equipment:   Intra-op Plan:   Post-operative Plan: Extubation in OR  Informed Consent: I have reviewed the patients History and Physical, chart, labs and discussed the procedure including the risks, benefits and alternatives for the proposed anesthesia with the patient or authorized representative who has indicated his/her understanding and  acceptance.   Dental advisory given  Plan Discussed with: CRNA  Anesthesia Plan Comments:         Anesthesia Quick Evaluation

## 2012-07-27 ENCOUNTER — Encounter (HOSPITAL_COMMUNITY): Payer: Self-pay | Admitting: Urology

## 2012-09-12 ENCOUNTER — Telehealth (INDEPENDENT_AMBULATORY_CARE_PROVIDER_SITE_OTHER): Payer: Self-pay | Admitting: General Surgery

## 2012-09-12 NOTE — Telephone Encounter (Signed)
Called to speak with patient and her husband took message. Confirmed appointment for tomorrow and asked if she had any recent testing done. Her husband Billey Gosling) who said no. He also confirmed last time she saw to Magrinat was in May of this year.

## 2012-09-13 ENCOUNTER — Encounter (INDEPENDENT_AMBULATORY_CARE_PROVIDER_SITE_OTHER): Payer: Self-pay | Admitting: General Surgery

## 2012-09-13 ENCOUNTER — Ambulatory Visit (INDEPENDENT_AMBULATORY_CARE_PROVIDER_SITE_OTHER): Payer: Medicare Other | Admitting: General Surgery

## 2012-09-13 VITALS — BP 122/70 | HR 74 | Temp 97.8°F | Resp 16 | Ht 62.5 in | Wt 134.5 lb

## 2012-09-13 DIAGNOSIS — C50919 Malignant neoplasm of unspecified site of unspecified female breast: Secondary | ICD-10-CM

## 2012-09-13 NOTE — Progress Notes (Signed)
Patient ID: ALEXSYS ESKIN, female   DOB: 10-Nov-1945, 66 y.o.   MRN: [id number]  Chief Complaint  Patient presents with  . Routine Post Op    Mass removal - 2012    HPI [name] is a 66 y.o. female.  She returns for long-term breast cancer followup.  This patient has a history of invasive ductal carcinoma left breast, stage TI B., N0. She ultimately has had bilateral total mastectomies and left axillary sentinel node biopsy. She had reconstruction. She was not satisfied with that. She has had a redo reconstruction by Dr. Golden Pop at [facility] including nipple reconstruction and tattooing and she is very pleased with that. She also sees Gus Magrinat And she states that he may be discharging her from his care. She states that she wants to see me once a year.  She also is being evaluated by Dr. Dorris Fetch for an aortic aneurysm and  lung nodule and a scheduled followup with him in a couple of months. HPI  Past Medical History  Diagnosis Date  . Thoracic aortic aneurysm     Dr. Dorris Fetch  . Breast cancer     s/p bilateral mastectomies  . Hiatal hernia   . HTN (hypertension)   . GERD (gastroesophageal reflux disease)     history of  . Kidney stones     Past Surgical History  Procedure Date  . Mastectomy 03/2008    bilateral  . Gastric fundoplication   . Breast reconstruction     bilateral & implants  . Abdominal hysterectomy 1984  . Cesarean section 1977  . Hemorroidectomy   . Cholecystectomy   . Rotator cuff repair 10 years ago     Left   . Cystoscopy w/ ureteral stent placement 07/26/2012    Procedure: CYSTOSCOPY WITH RETROGRADE PYELOGRAM/URETERAL STENT PLACEMENT;  Surgeon: Garnett Farm, MD;  Location: [facility];  Service: Urology;  Laterality: Left;  . Stone extraction with basket 07/26/2012    Procedure: STONE EXTRACTION WITH BASKET;  Surgeon: Garnett Farm, MD;  Location: [facility];  Service: Urology;  Laterality: Left;  . Ureteroscopy 07/26/2012    Procedure: URETEROSCOPY;   Surgeon: Garnett Farm, MD;  Location: [facility];  Service: Urology;  Laterality: Left;    Family History  Problem Relation Age of Onset  . Diabetes Mother   . Heart disease Father   . Hyperlipidemia Sister   . Diabetes Sister   . Diabetes Sister   . Hyperlipidemia Sister     Social History History  Substance Use Topics  . Smoking status: Never Smoker   . Smokeless tobacco: Never Used  . Alcohol Use: No    Allergies  Allergen Reactions  . Ceclor (Cefaclor) Rash    All over body.  . Sulfa Antibiotics     Ulcer in mouth    Current Outpatient Prescriptions  Medication Sig Dispense Refill  . ALPRAZolam (XANAX) 0.5 MG tablet Take 0.5 mg by mouth at bedtime as needed. For sleep      . amLODipine-benazepril (LOTREL) 5-10 MG per capsule Take 1 capsule by mouth daily.      . Calcium Carbonate-Vitamin D (CALCIUM + D) 600-200 MG-UNIT TABS Take 1 tablet by mouth daily.        Marland Kitchen exemestane (AROMASIN) 25 MG tablet Take 25 mg by mouth daily after breakfast.      . IRON PO Take 2,000 mg by mouth.        Review of Systems Review of Systems  Constitutional: Negative for fever, chills and unexpected weight change.  HENT: Negative for hearing loss, congestion, sore throat, trouble swallowing and voice change.   Eyes: Negative for visual disturbance.  Respiratory: Negative for cough and wheezing.   Cardiovascular: Negative for chest pain, palpitations and leg swelling.  Gastrointestinal: Negative for nausea, vomiting, abdominal pain, diarrhea, constipation, blood in stool, abdominal distention and anal bleeding.  Genitourinary: Negative for hematuria, vaginal bleeding and difficulty urinating.  Musculoskeletal: Negative for arthralgias.  Skin: Negative for rash and wound.  Neurological: Negative for seizures, syncope and headaches.  Hematological: Negative for adenopathy. Does not bruise/bleed easily.  Psychiatric/Behavioral: Negative for confusion.    Blood pressure 122/70, pulse 74,  temperature 97.8 F (36.6 C), temperature source Temporal, resp. rate 16, height 5' 2.5" (1.588 m), weight 134 lb 8 oz (61.009 kg).  Physical Exam Physical Exam  Constitutional: She is oriented to person, place, and time. She appears well-developed and well-nourished. No distress.  HENT:  Head: Normocephalic and atraumatic.  Nose: Nose normal.  Mouth/Throat: No oropharyngeal exudate.  Eyes: Conjunctivae normal and EOM are normal. Pupils are equal, round, and reactive to light. Left eye exhibits no discharge. No scleral icterus.  Neck: Neck supple. No JVD present. No tracheal deviation present. No thyromegaly present.  Cardiovascular: Normal rate, regular rhythm, normal heart sounds and intact distal pulses.   No murmur heard. Pulmonary/Chest: Effort normal and breath sounds normal. No respiratory distress. She has no wheezes. She has no rales. She exhibits no tenderness.       Bilateral  implant based reconstructions look very good. Good symmetry. Nipple reconstruction and tattooing look very good. No arm swelling. No palpable nodules or ulcerations. No axillary adenopathy. No evidence of recurrent cancer on the chest wall.  Abdominal: Soft. Bowel sounds are normal. She exhibits no distension and no mass. There is no tenderness. There is no rebound and no guarding.  Musculoskeletal: She exhibits no edema and no tenderness.  Lymphadenopathy:    She has no cervical adenopathy.  Neurological: She is alert and oriented to person, place, and time. She exhibits normal muscle tone. Coordination normal.  Skin: Skin is warm. No rash noted. She is not diaphoretic. No erythema. No pallor.  Psychiatric: She has a normal mood and affect. Her behavior is normal. Judgment and thought content normal.    Data Reviewed Old chart. Cancer center records.  Assessment    Invasive ductal carcinoma left breast, stage TI B. N0.  No evidence of recurrence following bilateral total mastectomies, SLN biopsy and  multiple procedures for bilateral reconstruction.  Aortic aneurysm  Lung nodule    Plan    Continue followup with Dr. Andrey Spearman and Dr. Ronny Flurry regarding cardiovascular and pulmonary issues  Return to see me in one year for physical exam.       Angelia Mould. Derrell Lolling, M.D., [facility] General and Minimally invasive Surgery Breast and Colorectal Surgery Office:   [phone] Pager:   [phone]  09/13/2012, 10:20 AM

## 2012-09-13 NOTE — Patient Instructions (Signed)
Your physical exam today is normal. Your reconstruction looks great. There is no evidence of any cancer or abnormally enlarged lymph nodes.  Return to see Dr. Derrell Lolling in one year for physical exam.

## 2012-11-27 ENCOUNTER — Other Ambulatory Visit: Payer: Self-pay | Admitting: *Deleted

## 2012-12-04 ENCOUNTER — Encounter: Payer: Self-pay | Admitting: Thoracic Surgery (Cardiothoracic Vascular Surgery)

## 2012-12-04 ENCOUNTER — Ambulatory Visit
Admission: RE | Admit: 2012-12-04 | Discharge: 2012-12-04 | Disposition: A | Discharge status: Home / Self Care | Payer: Medicare Other | Source: Ambulatory Visit | Attending: Thoracic Surgery (Cardiothoracic Vascular Surgery) | Admitting: Thoracic Surgery (Cardiothoracic Vascular Surgery)

## 2012-12-04 ENCOUNTER — Ambulatory Visit (INDEPENDENT_AMBULATORY_CARE_PROVIDER_SITE_OTHER): Payer: Medicare Other | Admitting: Thoracic Surgery (Cardiothoracic Vascular Surgery)

## 2012-12-04 VITALS — BP 114/80 | HR 80 | Resp 18 | Ht 62.5 in | Wt 134.0 lb

## 2012-12-04 DIAGNOSIS — I712 Thoracic aortic aneurysm, without rupture: Secondary | ICD-10-CM | POA: Diagnosis not present

## 2012-12-04 MED ORDER — IOHEXOL 350 MG/ML SOLN
70.0000 mL | Freq: Once | INTRAVENOUS | Status: AC | PRN
  Administered 2012-12-04: 70 mL via INTRAVENOUS

## 2012-12-04 NOTE — Progress Notes (Signed)
HPI:  [name] returns for a scheduled annual followup visit today. I have been following her for several years now for a 4.0 cm descending aortic aneurysm. This was first noted in 2010. She also was noted to have a 5.7 mm lesion in the superior segment of the right lower lobe at that time as well. He was last seen in the office about 18 months ago at which time both of those lesions were stable.  She says that she's been doing well. She denies any chest pain or shortness of breath. She denies cough, wheeze, hemoptysis. Her weight has been stable. She is coming up on her 5 year anniversary of treatment for breast cancer in May of this year.  Past Medical History  Diagnosis Date  . Thoracic aortic aneurysm     Dr. Dorris Fetch  . Breast cancer     s/p bilateral mastectomies  . Hiatal hernia   . HTN (hypertension)   . GERD (gastroesophageal reflux disease)     history of  . Kidney stones       Current Outpatient Prescriptions  Medication Sig Dispense Refill  . ALPRAZolam (XANAX) 0.5 MG tablet Take 0.5 mg by mouth at bedtime as needed. For sleep      . amLODipine-benazepril (LOTREL) 5-10 MG per capsule Take 1 capsule by mouth daily.      . Calcium Carbonate-Vitamin D (CALCIUM + D) 600-200 MG-UNIT TABS Take 1 tablet by mouth daily.        . Cholecalciferol (VITAMIN D) 2000 UNITS CAPS Take by mouth daily.      Marland Kitchen exemestane (AROMASIN) 25 MG tablet Take 25 mg by mouth daily after breakfast.       No current facility-administered medications for this visit.    Physical Exam BP 114/80  Pulse 80  Resp 18  Ht 5' 2.5" (1.588 m)  Wt 134 lb (60.782 kg)  BMI 24.1 kg/m2  SpO2 97% Well-appearing 67 year old woman in no acute distress Neuro alert and oriented x3 with no deficits No cervical or suprapubic or adenopathy Lungs clear Heart exam regular rate and rhythm normal S1 and S2 no rubs or murmurs  Diagnostic Tests: CT of chest 12/04/2012 *RADIOLOGY REPORT*  Clinical Data:  Follow-up ascending aortic aneurysm. Personal  history breast carcinoma.  CT ANGIOGRAPHY CHEST  Technique: Multidetector CT imaging of the chest using the  standard protocol during bolus administration of intravenous  contrast. Multiplanar reconstructed images including MIPs were  obtained and reviewed to evaluate the vascular anatomy.  Contrast: 70mL OMNIPAQUE IOHEXOL 350 MG/ML SOLN  Comparison: 11/15/2011 and 05/25/2010  Findings: Ascending thoracic aortic aneurysm remains stable,  measuring 4.0 cm in maximum diameter of 53. No evidence of  thoracic aortic dissection or mediastinal hematoma. No mediastinal  or hilar soft tissue masses or lymphadenopathy identified. The  No adenopathy seen elsewhere within the thorax. No evidence of  pleural or pericardial effusion. Previous bilateral mastectomies  with breast reconstruction noted. No evidence of axillary  lymphadenopathy or chest wall mass.  A nodular density in the right upper lung adjacent to the major  fissure measuring 5 mm remains stable, consistent with benign  etiology. No new or enlarging pulmonary nodules or masses are  identified. No evidence of pulmonary infiltrate or central  endobronchial lesion. Both adrenal glands are normal in  appearance. The no suspicious bone lesions are identified.  IMPRESSION:  1. Stable ascending thoracic aortic aneurysm measuring 4.0 cm in  maximum diameter. No evidence of thoracic aortic dissection or  other acute findings.  2. Stable right lung nodule, consistent with benign etiology. No  evidence of recurrent or metastatic disease within the thorax.   Impression: 67 year old woman with a stable 4 cm ascending aortic aneurysm. Her blood pressure is well-controlled. This will need to continue to be followed over time. She had questions regarding indications for surgery. I told her that an interval size change of greater than 5 mm or and absolute size of greater than 5.0 cm would be  indications. I would use 5 cm as a cut off her given that she is relatively small.  The lung nodule has been stable over time and is benign.  Plan: Return in one year with CT angiogram of chest.

## 2013-01-08 DIAGNOSIS — H52229 Regular astigmatism, unspecified eye: Secondary | ICD-10-CM | POA: Diagnosis not present

## 2013-01-08 DIAGNOSIS — H2510 Age-related nuclear cataract, unspecified eye: Secondary | ICD-10-CM | POA: Diagnosis not present

## 2013-01-31 ENCOUNTER — Other Ambulatory Visit (HOSPITAL_BASED_OUTPATIENT_CLINIC_OR_DEPARTMENT_OTHER): Payer: Medicare Other | Admitting: Lab

## 2013-01-31 DIAGNOSIS — C50419 Malignant neoplasm of upper-outer quadrant of unspecified female breast: Secondary | ICD-10-CM

## 2013-01-31 DIAGNOSIS — C50919 Malignant neoplasm of unspecified site of unspecified female breast: Secondary | ICD-10-CM

## 2013-01-31 LAB — COMPREHENSIVE METABOLIC PANEL (CC13)
AST: 18 U/L (ref 5–34)
BUN: 19.9 mg/dL (ref 7.0–26.0)
Calcium: 9.4 mg/dL (ref 8.4–10.4)
Chloride: 107 mEq/L (ref 98–107)
Creatinine: 0.8 mg/dL (ref 0.6–1.1)
Glucose: 94 mg/dl (ref 70–99)

## 2013-01-31 LAB — CBC WITH DIFFERENTIAL/PLATELET
BASO%: 0.4 % (ref 0.0–2.0)
Basophils Absolute: 0 10*3/uL (ref 0.0–0.1)
EOS%: 0.3 % (ref 0.0–7.0)
Eosinophils Absolute: 0 10*3/uL (ref 0.0–0.5)
HCT: 38.6 % (ref 34.8–46.6)
HGB: 12.9 g/dL (ref 11.6–15.9)
LYMPH%: 37.9 % (ref 14.0–49.7)
MCH: 31.9 pg (ref 25.1–34.0)
MCHC: 33.6 g/dL (ref 31.5–36.0)
MCV: 94.9 fL (ref 79.5–101.0)
MONO#: 0.4 10*3/uL (ref 0.1–0.9)
MONO%: 7.6 % (ref 0.0–14.0)
NEUT#: 3.1 10*3/uL (ref 1.5–6.5)
NEUT%: 53.8 % (ref 38.4–76.8)
Platelets: 213 10*3/uL (ref 145–400)
RBC: 4.06 10*6/uL (ref 3.70–5.45)
RDW: 12.9 % (ref 11.2–14.5)
WBC: 5.8 10*3/uL (ref 3.9–10.3)
lymph#: 2.2 10*3/uL (ref 0.9–3.3)

## 2013-02-07 ENCOUNTER — Other Ambulatory Visit: Payer: Self-pay | Admitting: Oncology

## 2013-02-07 ENCOUNTER — Ambulatory Visit (HOSPITAL_BASED_OUTPATIENT_CLINIC_OR_DEPARTMENT_OTHER): Payer: Medicare Other | Admitting: Oncology

## 2013-02-07 VITALS — BP 123/81 | HR 79 | Temp 98.4°F | Resp 20 | Ht 62.0 in | Wt 138.2 lb

## 2013-02-07 DIAGNOSIS — I712 Thoracic aortic aneurysm, without rupture: Secondary | ICD-10-CM

## 2013-02-07 DIAGNOSIS — Z853 Personal history of malignant neoplasm of breast: Secondary | ICD-10-CM

## 2013-02-07 DIAGNOSIS — C50912 Malignant neoplasm of unspecified site of left female breast: Secondary | ICD-10-CM

## 2013-02-07 NOTE — Progress Notes (Signed)
ID: [name]   DOB: [birth year]  MR#: [id number]  CSN#:[id number]  PCP: Garlan Fillers, MD GYN: Hilbert Bible SU: Claud Kelp OTHER MD: Charlett Lango  HISTORY OF PRESENT ILLNESS:  [name] had a digital screening mammogram at the breast cancer on Feb 14, 2008.  This showed a possible mass in the left breast.  On the 27th she returned for diagnostic left mammography and ultrasound.  There was no palpable mass noted by Dr. Deboraha Sprang, but spot compression views did show multiple round nodules in the medial aspect of the left breast.  By ultrasound there was a dilated ductal system and two small hypoechoic masses measuring approximately 7 and 6 mm in size.  On June 3 the patient underwent ultrasound guided needle localization of the breast mass and the pathology showed an invasive mammary carcinoma ([id number] and [id number]) which was a 100% ER and a 100% PR positive.  The MIB-1 was 10% and the HER2/neu test was negative at 1+.    With this information the patient was referred to Dr. Derrell Lolling, preoperative chest x-ray showed some density in the retrocardiac areas which was felt probably to be artifactual, but just to be sure a chest CT was obtained July 10, this showed indeed that shadow to be an artifactual superimposition of normal structures.  There was a 5 mm small interstitial nodule in the right upper lobe, which is obviously nonspecific and perhaps at some point in the future could be reimaged.  The patient then proceeded to definitive surgery and because she has had multiple biopsies previously for papillomas she opted for bilateral mastectomies with left sentinel lymph node sampling.  This was performed April 04, 2008 and showed ([id number]) no residual carcinoma in the breast, both lymph nodes negative and the right breast benign. Her subsequent history is as detailed below.  INTERVAL HISTORY: [name] returns today followup of her breast cancer. She Neulasta exemestane tablet last week. She is ready to  "graduate" today!   REVIEW OF SYSTEMS: She is going to the gym 2 or 3 times a week, her goal being 5 times a week. She's doing a lot of traveling with her husband and also visiting her son in [place]. She has a cataract that's growing and needs to be removed. Otherwise a detailed review of systems today was entirely benign.  PAST MEDICAL HISTORY: Past Medical History  Diagnosis Date  . Thoracic aortic aneurysm     Dr. Dorris Fetch  . Breast cancer     s/p bilateral mastectomies  . Hiatal hernia   . HTN (hypertension)   . GERD (gastroesophageal reflux disease)     history of  . Kidney stones   Significant for C-section x 1, remote hysterectomy with bilateral salpingo-oophorectomy, multiple biopsies on lumpectomies in the past, history of Nissen fundoplication, history of cholecystectomy, history of appendectomy, history hemorrhoidectomy, history of left shoulder surgery, hypertension and history of nephrolithiasis, status post lithotripsy.   PAST SURGICAL HISTORY: Past Surgical History  Procedure Laterality Date  . Mastectomy  03/2008    bilateral  . Gastric fundoplication    . Breast reconstruction      bilateral & implants  . Abdominal hysterectomy  1984  . Cesarean section  1977  . Hemorroidectomy    . Cholecystectomy    . Rotator cuff repair  10 years ago     Left   . Cystoscopy w/ ureteral stent placement  07/26/2012    Procedure: CYSTOSCOPY WITH RETROGRADE PYELOGRAM/URETERAL STENT PLACEMENT;  Surgeon: Garnett Farm,  MD;  Location: [facility];  Service: Urology;  Laterality: Left;  . Stone extraction with basket  07/26/2012    Procedure: STONE EXTRACTION WITH BASKET;  Surgeon: Garnett Farm, MD;  Location: [facility];  Service: Urology;  Laterality: Left;  . Ureteroscopy  07/26/2012    Procedure: URETEROSCOPY;  Surgeon: Garnett Farm, MD;  Location: [facility];  Service: Urology;  Laterality: Left;    FAMILY HISTORY Family History  Problem Relation Age of Onset  . Diabetes  Mother   . Heart disease Father   . Hyperlipidemia Sister   . Diabetes Sister   . Diabetes Sister   . Hyperlipidemia Sister   The patient's father died from a stroke, the patient's mother from diabetes in the setting of COPD (in a nonsmoker), one of the patient's sisters died from uterine cancer, two other sisters have diabetes.  There is no history of breast cancer in the family except for a niece who was diagnosed at age 14.  GYNECOLOGIC HISTORY: She is Gx, P1, first pregnancy to term age 28, last menstrual period with hysterectomy in 1984.  She took hormones for more than 20 years, stopping only when she had this diagnosis.  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 Her husband Billey Gosling used to work for [employer]. They're both now retired. Their son lives in [place], works in Airline pilot. The patient has no grandchildren "and we are not expecting any". She attends a [employer].   ADVANCED DIRECTIVES: Not in place  HEALTH MAINTENANCE: History  Substance Use Topics  . Smoking status: Never Smoker   . Smokeless tobacco: Never Used  . Alcohol Use: No     Colonoscopy:[facility]  PAP: [place]; status post hysterectomy  Bone density: May 2012/osteoporosis  Lipid panel:  Allergies  Allergen Reactions  . Ceclor (Cefaclor) Rash    All over body.  . Sulfa Antibiotics     Ulcer in mouth    Current Outpatient Prescriptions  Medication Sig Dispense Refill  . ALPRAZolam (XANAX) 0.5 MG tablet Take 0.5 mg by mouth at bedtime as needed. For sleep      . amLODipine-benazepril (LOTREL) 5-10 MG per capsule Take 1 capsule by mouth daily.      . Calcium Carbonate-Vitamin D (CALCIUM + D) 600-200 MG-UNIT TABS Take 1 tablet by mouth daily.        . Cholecalciferol (VITAMIN D) 2000 UNITS CAPS Take by mouth daily.      Marland Kitchen exemestane (AROMASIN) 25 MG tablet Take 25 mg by mouth daily after breakfast.       No current facility-administered medications for  this visit.    OBJECTIVE: Middle-aged white woman who appears well Filed Vitals:   02/07/13 1048  BP: 123/81  Pulse: 79  Temp: 98.4 F (36.9 C)  Resp: 20     Body mass index is 25.27 kg/(m^2).    ECOG FS: 0  Sclerae unicteric Oropharynx clear No cervical or supraclavicular adenopathy Lungs no rales or rhonchi Heart regular rate and rhythm Abd benign MSK no focal spinal tenderness, no peripheral edema Neuro: nonfocal, well oriented, appropriate affect Breasts: She is status post bilateral mastectomies with bilateral implant reconstruction. There is no evidence of local recurrence. Both axillae are benign  LAB RESULTS: Lab Results  Component Value Date   WBC 5.8 01/31/2013   NEUTROABS 3.1 01/31/2013   HGB 12.9 01/31/2013   HCT 38.6 01/31/2013   MCV 94.9 01/31/2013   PLT  213 01/31/2013      Chemistry      Component Value Date/Time   NA 141 01/31/2013 1137   NA 138 07/17/2012 1400   K 3.8 01/31/2013 1137   K 3.7 07/17/2012 1400   CL 107 01/31/2013 1137   CL 103 07/17/2012 1400   CO2 25 01/31/2013 1137   CO2 24 07/17/2012 1400   BUN 19.9 01/31/2013 1137   BUN 21 12/03/2012 1300   CREATININE 0.8 01/31/2013 1137   CREATININE 0.62 12/03/2012 1300   CREATININE 0.62 07/17/2012 1400      Component Value Date/Time   CALCIUM 9.4 01/31/2013 1137   CALCIUM 9.1 07/17/2012 1400   ALKPHOS 62 01/31/2013 1137   ALKPHOS 59 02/02/2012 0950   AST 18 01/31/2013 1137   AST 15 02/02/2012 0950   ALT 9 01/31/2013 1137   ALT 9 02/02/2012 0950   BILITOT 0.61 01/31/2013 1137   BILITOT 0.6 02/02/2012 0950       Lab Results  Component Value Date   LABCA2 21 02/02/2012    No components found with this basename: [id number]    No results found for this basename: INR,  in the last 168 hours  Urinalysis    Component Value Date/Time   COLORURINE YELLOW 07/19/2012 0716   APPEARANCEUR CLEAR 07/19/2012 0716   LABSPEC 1.013 07/19/2012 0716   PHURINE 5.5 07/19/2012 0716   GLUCOSEU NEGATIVE 07/19/2012 0716   HGBUR MODERATE*  07/19/2012 0716   BILIRUBINUR NEGATIVE 07/19/2012 0716   KETONESUR NEGATIVE 07/19/2012 0716   PROTEINUR NEGATIVE 07/19/2012 0716   UROBILINOGEN 0.2 07/19/2012 0716   NITRITE NEGATIVE 07/19/2012 0716   LEUKOCYTESUR NEGATIVE 07/19/2012 0716    STUDIES: CT ANGIOGRAPHY CHEST 12/04/2012 Technique: Multidetector CT imaging of the chest using the  standard protocol during bolus administration of intravenous  contrast. Multiplanar reconstructed images including MIPs were  obtained and reviewed to evaluate the vascular anatomy.  Contrast: 70mL OMNIPAQUE IOHEXOL 350 MG/ML SOLN  Comparison: 11/15/2011 and 05/25/2010  Findings: Ascending thoracic aortic aneurysm remains stable,  measuring 4.0 cm in maximum diameter of 53. No evidence of  thoracic aortic dissection or mediastinal hematoma. No mediastinal  or hilar soft tissue masses or lymphadenopathy identified. The  No adenopathy seen elsewhere within the thorax. No evidence of  pleural or pericardial effusion. Previous bilateral mastectomies  with breast reconstruction noted. No evidence of axillary  lymphadenopathy or chest wall mass.  A nodular density in the right upper lung adjacent to the major  fissure measuring 5 mm remains stable, consistent with benign  etiology. No new or enlarging pulmonary nodules or masses are  identified. No evidence of pulmonary infiltrate or central  endobronchial lesion. Both adrenal glands are normal in  appearance. The no suspicious bone lesions are identified.  IMPRESSION:  1. Stable ascending thoracic aortic aneurysm measuring 4.0 cm in  maximum diameter. No evidence of thoracic aortic dissection or  other acute findings.  2. Stable right lung nodule, consistent with benign etiology. No  evidence of recurrent or metastatic disease within the thorax.  Original Report Authenticated By: Myles Rosenthal, M.D.     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 Of course I will be glad to see her in the future if the need arises as of now no further appointments are being made for her here I am sending a letter to her other physicians of and particularly her primary care physician Dr. Jarold Motto. In terms of breast cancer followup all she will need is a yearly physician breast exam.  Zac Torti C    02/07/2013

## 2013-05-22 DIAGNOSIS — H2510 Age-related nuclear cataract, unspecified eye: Secondary | ICD-10-CM | POA: Diagnosis not present

## 2013-06-07 DIAGNOSIS — H2510 Age-related nuclear cataract, unspecified eye: Secondary | ICD-10-CM | POA: Diagnosis not present

## 2013-06-12 ENCOUNTER — Emergency Department (HOSPITAL_COMMUNITY): Payer: Medicare Other

## 2013-06-12 ENCOUNTER — Emergency Department (HOSPITAL_COMMUNITY)
Admission: EM | Admit: 2013-06-12 | Discharge: 2013-06-12 | Disposition: A | Discharge status: Home / Self Care | Payer: Medicare Other | Attending: Emergency Medicine | Admitting: Emergency Medicine

## 2013-06-12 ENCOUNTER — Encounter (HOSPITAL_COMMUNITY): Payer: Self-pay | Admitting: Emergency Medicine

## 2013-06-12 DIAGNOSIS — R109 Unspecified abdominal pain: Secondary | ICD-10-CM | POA: Insufficient documentation

## 2013-06-12 DIAGNOSIS — I10 Essential (primary) hypertension: Secondary | ICD-10-CM | POA: Insufficient documentation

## 2013-06-12 DIAGNOSIS — Z90710 Acquired absence of both cervix and uterus: Secondary | ICD-10-CM | POA: Insufficient documentation

## 2013-06-12 DIAGNOSIS — Z79899 Other long term (current) drug therapy: Secondary | ICD-10-CM | POA: Insufficient documentation

## 2013-06-12 DIAGNOSIS — Z853 Personal history of malignant neoplasm of breast: Secondary | ICD-10-CM | POA: Diagnosis not present

## 2013-06-12 DIAGNOSIS — Z8719 Personal history of other diseases of the digestive system: Secondary | ICD-10-CM | POA: Diagnosis not present

## 2013-06-12 DIAGNOSIS — N200 Calculus of kidney: Secondary | ICD-10-CM | POA: Diagnosis not present

## 2013-06-12 DIAGNOSIS — Z87442 Personal history of urinary calculi: Secondary | ICD-10-CM | POA: Diagnosis not present

## 2013-06-12 DIAGNOSIS — R319 Hematuria, unspecified: Secondary | ICD-10-CM | POA: Diagnosis not present

## 2013-06-12 DIAGNOSIS — R110 Nausea: Secondary | ICD-10-CM | POA: Diagnosis not present

## 2013-06-12 DIAGNOSIS — Z9089 Acquired absence of other organs: Secondary | ICD-10-CM | POA: Diagnosis not present

## 2013-06-12 DIAGNOSIS — D1803 Hemangioma of intra-abdominal structures: Secondary | ICD-10-CM | POA: Diagnosis not present

## 2013-06-12 LAB — CBC WITH DIFFERENTIAL/PLATELET
Basophils Absolute: 0 10*3/uL (ref 0.0–0.1)
Basophils Relative: 0 % (ref 0–1)
Eosinophils Absolute: 0 10*3/uL (ref 0.0–0.7)
Eosinophils Relative: 0 % (ref 0–5)
HCT: 42.5 % (ref 36.0–46.0)
Hemoglobin: 14 g/dL (ref 12.0–15.0)
Lymphocytes Relative: 21 % (ref 12–46)
Lymphs Abs: 1.4 10*3/uL (ref 0.7–4.0)
MCHC: 32.9 g/dL (ref 30.0–36.0)
Neutro Abs: 4.7 10*3/uL (ref 1.7–7.7)
Neutrophils Relative %: 71 % (ref 43–77)
RBC: 4.47 MIL/uL (ref 3.87–5.11)
WBC: 6.6 10*3/uL (ref 4.0–10.5)

## 2013-06-12 LAB — COMPREHENSIVE METABOLIC PANEL
ALT: 15 U/L (ref 0–35)
AST: 27 U/L (ref 0–37)
Albumin: 4.4 g/dL (ref 3.5–5.2)
Calcium: 9.9 mg/dL (ref 8.4–10.5)
GFR calc Af Amer: >90 mL/min (ref >90)
Sodium: 136 mEq/L (ref 135–145)
Total Protein: 8 g/dL (ref 6.0–8.3)

## 2013-06-12 LAB — URINALYSIS, ROUTINE W REFLEX MICROSCOPIC
Glucose, UA: NEGATIVE mg/dL
Hgb urine dipstick: NEGATIVE
Specific Gravity, Urine: 1.03 (ref 1.005–1.030)
Urobilinogen, UA: 0.2 mg/dL (ref 0.0–1.0)
pH: 5 (ref 5.0–8.0)

## 2013-06-12 MED ORDER — MORPHINE SULFATE 4 MG/ML IJ SOLN
4.0000 mg | INTRAMUSCULAR | Status: DC | PRN
  Administered 2013-06-12: 4 mg via INTRAVENOUS
  Filled 2013-06-12: qty 1

## 2013-06-12 MED ORDER — SODIUM CHLORIDE 0.9 % IV BOLUS (SEPSIS)
1000.0000 mL | Freq: Once | INTRAVENOUS | Status: AC
  Administered 2013-06-12: 1000 mL via INTRAVENOUS

## 2013-06-12 MED ORDER — DICYCLOMINE HCL 20 MG PO TABS: 20.0000 mg | ORAL_TABLET | Freq: Two times a day (BID) | ORAL | Status: DC

## 2013-06-12 MED ORDER — ONDANSETRON HCL 4 MG/2ML IJ SOLN
4.0000 mg | Freq: Once | INTRAMUSCULAR | Status: AC
  Administered 2013-06-12: 4 mg via INTRAVENOUS
  Filled 2013-06-12: qty 2

## 2013-06-12 MED ORDER — KETOROLAC TROMETHAMINE 30 MG/ML IJ SOLN
30.0000 mg | Freq: Once | INTRAMUSCULAR | Status: AC
  Administered 2013-06-12: 30 mg via INTRAVENOUS
  Filled 2013-06-12: qty 1

## 2013-06-12 MED ORDER — TAMSULOSIN HCL 0.4 MG PO CAPS
0.4000 mg | ORAL_CAPSULE | Freq: Once | ORAL | Status: AC
  Administered 2013-06-12: 0.4 mg via ORAL
  Filled 2013-06-12: qty 1

## 2013-06-12 MED ORDER — ONDANSETRON HCL 4 MG PO TABS: 4.0000 mg | ORAL_TABLET | Freq: Four times a day (QID) | ORAL | Status: DC

## 2013-06-12 NOTE — ED Provider Notes (Signed)
CSN: [id number]     Arrival date & time 06/12/13  1208 History   First MD Initiated Contact with Patient 06/12/13 1216     Chief Complaint  Patient presents with  . Flank Pain    HPI  Awakened 03 100 this morning of right flank and right mid abdominal pain. Became nauseated. She states her pain is very typical for her from previous episodes of ureteral stones, however she is nauseated this time which is new for her. She has had several episodes of ureteral colic and stone she has had extractions her stents were lithotripsies done with each episode. Most recently 07/26/12 with Dr. Vernie Ammons had a stone extraction and stent placed.  Also has a known  stable 4 cm ascending aneurysm per yearly CTs of the chest.  No fevers no dysuria. No chest pain no neck pain no extremity symptoms.  Past Medical History  Diagnosis Date  . Thoracic aortic aneurysm     Dr. Dorris Fetch  . Breast cancer     s/p bilateral mastectomies  . Hiatal hernia   . HTN (hypertension)   . GERD (gastroesophageal reflux disease)     history of  . Kidney stones    Past Surgical History  Procedure Laterality Date  . Mastectomy  03/2008    bilateral  . Gastric fundoplication    . Breast reconstruction      bilateral & implants  . Abdominal hysterectomy  1984  . Cesarean section  1977  . Hemorroidectomy    . Cholecystectomy    . Rotator cuff repair  10 years ago     Left   . Cystoscopy w/ ureteral stent placement  07/26/2012    Procedure: CYSTOSCOPY WITH RETROGRADE PYELOGRAM/URETERAL STENT PLACEMENT;  Surgeon: Garnett Farm, MD;  Location: [facility];  Service: Urology;  Laterality: Left;  . Stone extraction with basket  07/26/2012    Procedure: STONE EXTRACTION WITH BASKET;  Surgeon: Garnett Farm, MD;  Location: [facility];  Service: Urology;  Laterality: Left;  . Ureteroscopy  07/26/2012    Procedure: URETEROSCOPY;  Surgeon: Garnett Farm, MD;  Location: [facility];  Service: Urology;  Laterality: Left;   Family History    Problem Relation Age of Onset  . Diabetes Mother   . Heart disease Father   . Hyperlipidemia Sister   . Diabetes Sister   . Diabetes Sister   . Hyperlipidemia Sister    History  Substance Use Topics  . Smoking status: Never Smoker   . Smokeless tobacco: Never Used  . Alcohol Use: No   OB History   Grav Para Term Preterm Abortions TAB SAB Ect Mult Living                 Review of Systems  Constitutional: Negative for fever, chills, diaphoresis, appetite change and fatigue.  HENT: Negative for sore throat, mouth sores and trouble swallowing.   Eyes: Negative for visual disturbance.  Respiratory: Negative for cough, chest tightness, shortness of breath and wheezing.   Cardiovascular: Negative for chest pain.  Gastrointestinal: Positive for nausea and abdominal pain. Negative for vomiting, diarrhea and abdominal distention.  Endocrine: Negative for polydipsia, polyphagia and polyuria.  Genitourinary: Positive for hematuria and flank pain. Negative for dysuria, frequency and difficulty urinating.  Musculoskeletal: Negative for gait problem.  Skin: Negative for color change, pallor and rash.  Neurological: Negative for dizziness, syncope, light-headedness and headaches.  Hematological: Does not bruise/bleed easily.  Psychiatric/Behavioral: Negative for behavioral problems and confusion.  Allergies  Ceclor and Sulfa antibiotics  Home Medications   Current Outpatient Rx  Name  Route  Sig  Dispense  Refill  . acetaminophen (TYLENOL) 500 MG tablet   Oral   Take 1,000 mg by mouth every 6 (six) hours as needed for pain.         Marland Kitchen ALPRAZolam (XANAX) 0.5 MG tablet   Oral   Take 0.5 mg by mouth at bedtime as needed. For sleep         . amLODipine-benazepril (LOTREL) 5-10 MG per capsule   Oral   Take 1 capsule by mouth daily.         . Cholecalciferol (VITAMIN D) 2000 UNITS CAPS   Oral   Take 2,000 Units by mouth daily.          . Polyvinyl Alcohol-Povidone  (REFRESH OP)   Both Eyes   Place 1 drop into both eyes 3 (three) times daily as needed (for dry eyes).         Marland Kitchen dicyclomine (BENTYL) 20 MG tablet   Oral   Take 1 tablet (20 mg total) by mouth 2 (two) times daily.   20 tablet   0   . ondansetron (ZOFRAN) 4 MG tablet   Oral   Take 1 tablet (4 mg total) by mouth every 6 (six) hours.   12 tablet   0    BP 135/72  Pulse 56  Temp(Src) 97.9 F (36.6 C) (Oral)  Resp 20  SpO2 99% Physical Exam  Constitutional: She is oriented to person, place, and time. She appears well-developed and well-nourished. No distress.  HENT:  Head: Normocephalic.  Eyes: Conjunctivae are normal. Pupils are equal, round, and reactive to light. No scleral icterus.  Neck: Normal range of motion. Neck supple. No thyromegaly present.  Cardiovascular: Normal rate and regular rhythm.  Exam reveals no gallop and no friction rub.   No murmur heard. Pulmonary/Chest: Effort normal and breath sounds normal. No respiratory distress. She has no wheezes. She has no rales.  Abdominal: Soft. Bowel sounds are normal. She exhibits no distension. There is no tenderness. There is no rebound.    Musculoskeletal: Normal range of motion.  Neurological: She is alert and oriented to person, place, and time.  Skin: Skin is warm and dry. No rash noted.  Psychiatric: She has a normal mood and affect. Her behavior is normal.    ED Course  Procedures (including critical care time) Labs Review Labs Reviewed  COMPREHENSIVE METABOLIC PANEL - Abnormal; Notable for the following:    Glucose, Bld 115 (*)    All other components within normal limits  CBC WITH DIFFERENTIAL  LIPASE, BLOOD  URINALYSIS, ROUTINE W REFLEX MICROSCOPIC   Imaging Review Ct Abdomen Pelvis Wo Contrast  06/12/2013   CLINICAL DATA:  Right flank pain and hematuria. History of kidney stones.  EXAM: CT ABDOMEN AND PELVIS WITHOUT CONTRAST  TECHNIQUE: Multidetector CT imaging of the abdomen and pelvis was  performed following the standard protocol without intravenous contrast.  COMPARISON:  CT abdomen and pelvis with contrast 07/17/2012.  FINDINGS: The lung bases are clear without focal nodule, mass, or airspace disease. The heart size is normal. And no significant pleural or pericardial effusion is present. Breast implants are noted.  A 3.4 cm hemangioma is again noted at the inferior aspect of the right lobe of the liver. The spleen is unremarkable. Postsurgical changes are evident at the GE junction with multiple surgical clips, as before. This is most compatible  with a Nissen fundoplication. The stomach, duodenum, and pancreas are otherwise within normal limits. The common bile duct remains dilated at 10.5 cm following cholecystectomy. The adrenal glands are normal bilaterally.  A 3.5 mm nonobstructing stone is present at the lower pole of the right kidney. An additional punctate nonobstructing stone is evident. Did a cyst at the lower pole of the left kidney is stable. Three punctate nonobstructing stones at the lower pole of the left kidney are also stable. Ureters and urinary bladder are within normal limits.  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 No focal inflammatory changes are evident to suggest diverticulitis. Patient is status post appendectomy. The uterus and ovaries are surgically absent. No significant adenopathy or free fluid is present.  The bone windows are unremarkable.  IMPRESSION: 1. Stable bilateral nonobstructing nephrolithiasis. 2. Stable cyst at the lower pole of the left kidney. 3. 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   Electronically Signed   By: Gennette Pac   On: 06/12/2013 13:41    MDM   1. Abdominal pain      Discussion: On her most recent CT abdomen October 2013 she had multiple right and left-sided stones she has several large stones on the right greater than 5-6 mm. Considering this I have ordered a CT scan of the abdomen could identify position  and location and size of the stone.  14:15:  On recheck the patient is comfortable pain is down to a 2/10. CT scan is reassuring. Multiple postoperative changes from appendectomy cholecystectomy hysterectomy Nissen fundoplication. Diverticulitis noted but no diverticulitis. She is on a white blood cell count elevation. No ureteral stone. Stable renal stones. No hydronephrosis. No free fluid. No adenopathy. Her exam is benign she has no reproducible tenderness. I think at outpatient expectant management is all that is indicated at this time. With the nausea which was new for her this may be simply intestinal colic. Should recheck here with recurrence of pain. Worsening of pain. Bloody stools fever or other symptoms or changes not noted her discussed today.  Claudean Kinds, MD 06/12/13 [phone]

## 2013-06-12 NOTE — ED Notes (Signed)
Pt c/o rt flank pain since about 3 this morning.  Hx of kidney stones.

## 2013-06-24 DIAGNOSIS — H2510 Age-related nuclear cataract, unspecified eye: Secondary | ICD-10-CM | POA: Diagnosis not present

## 2013-06-24 DIAGNOSIS — H2589 Other age-related cataract: Secondary | ICD-10-CM | POA: Diagnosis not present

## 2013-06-24 DIAGNOSIS — H269 Unspecified cataract: Secondary | ICD-10-CM | POA: Diagnosis not present

## 2013-06-26 DIAGNOSIS — M899 Disorder of bone, unspecified: Secondary | ICD-10-CM | POA: Diagnosis not present

## 2013-06-26 DIAGNOSIS — I10 Essential (primary) hypertension: Secondary | ICD-10-CM | POA: Diagnosis not present

## 2013-06-26 DIAGNOSIS — Z79899 Other long term (current) drug therapy: Secondary | ICD-10-CM | POA: Diagnosis not present

## 2013-07-01 DIAGNOSIS — H2510 Age-related nuclear cataract, unspecified eye: Secondary | ICD-10-CM | POA: Diagnosis not present

## 2013-07-03 DIAGNOSIS — R3129 Other microscopic hematuria: Secondary | ICD-10-CM | POA: Diagnosis not present

## 2013-07-03 DIAGNOSIS — F411 Generalized anxiety disorder: Secondary | ICD-10-CM | POA: Diagnosis not present

## 2013-07-03 DIAGNOSIS — C50919 Malignant neoplasm of unspecified site of unspecified female breast: Secondary | ICD-10-CM | POA: Diagnosis not present

## 2013-07-03 DIAGNOSIS — N200 Calculus of kidney: Secondary | ICD-10-CM | POA: Diagnosis not present

## 2013-07-03 DIAGNOSIS — Z0000 Encounter for general adult medical examination without abnormal findings: Secondary | ICD-10-CM | POA: Diagnosis not present

## 2013-07-03 DIAGNOSIS — M899 Disorder of bone, unspecified: Secondary | ICD-10-CM | POA: Diagnosis not present

## 2013-07-03 DIAGNOSIS — Z1331 Encounter for screening for depression: Secondary | ICD-10-CM | POA: Diagnosis not present

## 2013-07-03 DIAGNOSIS — I714 Abdominal aortic aneurysm, without rupture: Secondary | ICD-10-CM | POA: Diagnosis not present

## 2013-07-03 DIAGNOSIS — Z79899 Other long term (current) drug therapy: Secondary | ICD-10-CM | POA: Diagnosis not present

## 2013-07-03 DIAGNOSIS — I10 Essential (primary) hypertension: Secondary | ICD-10-CM | POA: Diagnosis not present

## 2013-07-04 DIAGNOSIS — N200 Calculus of kidney: Secondary | ICD-10-CM | POA: Diagnosis not present

## 2013-07-08 DIAGNOSIS — H269 Unspecified cataract: Secondary | ICD-10-CM | POA: Diagnosis not present

## 2013-07-08 DIAGNOSIS — H2589 Other age-related cataract: Secondary | ICD-10-CM | POA: Diagnosis not present

## 2013-07-08 DIAGNOSIS — H2510 Age-related nuclear cataract, unspecified eye: Secondary | ICD-10-CM | POA: Diagnosis not present

## 2013-07-24 ENCOUNTER — Encounter (HOSPITAL_COMMUNITY): Payer: Medicare Other

## 2013-07-24 DIAGNOSIS — M810 Age-related osteoporosis without current pathological fracture: Secondary | ICD-10-CM | POA: Diagnosis not present

## 2013-07-25 ENCOUNTER — Other Ambulatory Visit (HOSPITAL_COMMUNITY): Payer: Self-pay | Admitting: Internal Medicine

## 2013-07-25 ENCOUNTER — Encounter (HOSPITAL_COMMUNITY): Admission: RE | Admit: 2013-07-25 | Payer: Medicare Other | Source: Ambulatory Visit

## 2013-08-27 DIAGNOSIS — Z79899 Other long term (current) drug therapy: Secondary | ICD-10-CM | POA: Diagnosis not present

## 2013-09-02 DIAGNOSIS — M25519 Pain in unspecified shoulder: Secondary | ICD-10-CM | POA: Diagnosis not present

## 2013-09-11 ENCOUNTER — Encounter (INDEPENDENT_AMBULATORY_CARE_PROVIDER_SITE_OTHER): Payer: Self-pay | Admitting: General Surgery

## 2013-09-20 ENCOUNTER — Ambulatory Visit (HOSPITAL_COMMUNITY)
Admission: RE | Admit: 2013-09-20 | Discharge: 2013-09-20 | Disposition: A | Discharge status: Home / Self Care | Payer: Medicare Other | Source: Ambulatory Visit | Attending: Internal Medicine | Admitting: Internal Medicine

## 2013-09-20 ENCOUNTER — Encounter (HOSPITAL_COMMUNITY): Payer: Self-pay

## 2013-09-20 DIAGNOSIS — M810 Age-related osteoporosis without current pathological fracture: Secondary | ICD-10-CM | POA: Insufficient documentation

## 2013-09-20 HISTORY — DX: Age-related osteoporosis without current pathological fracture: M81.0

## 2013-09-20 MED ORDER — ZOLEDRONIC ACID 5 MG/100ML IV SOLN
5.0000 mg | Freq: Once | INTRAVENOUS | Status: AC
  Administered 2013-09-20: 5 mg via INTRAVENOUS
  Filled 2013-09-20: qty 100

## 2013-09-20 MED ORDER — SODIUM CHLORIDE 0.9 % IV SOLN
Freq: Once | INTRAVENOUS | Status: AC
  Administered 2013-09-20: 12:00:00 via INTRAVENOUS

## 2013-09-20 NOTE — Progress Notes (Addendum)
Pt here for her RECLAST infusion. She states she does not take supplemental or dietary calcium per her Dr's recommendation. Her pre infusion calcium level is within range for infusion. Pt states she will call her MD and discuss whether she is to take any calcium after this infusion

## 2013-10-31 DIAGNOSIS — H02409 Unspecified ptosis of unspecified eyelid: Secondary | ICD-10-CM | POA: Insufficient documentation

## 2013-10-31 DIAGNOSIS — H53009 Unspecified amblyopia, unspecified eye: Secondary | ICD-10-CM | POA: Diagnosis not present

## 2013-10-31 DIAGNOSIS — H02839 Dermatochalasis of unspecified eye, unspecified eyelid: Secondary | ICD-10-CM | POA: Diagnosis not present

## 2013-11-06 ENCOUNTER — Ambulatory Visit (INDEPENDENT_AMBULATORY_CARE_PROVIDER_SITE_OTHER): Payer: Medicare Other | Admitting: General Surgery

## 2013-11-11 ENCOUNTER — Encounter: Payer: Self-pay | Admitting: Physician Assistant

## 2013-11-26 ENCOUNTER — Other Ambulatory Visit: Payer: Self-pay

## 2013-11-26 DIAGNOSIS — I7121 Aneurysm of the ascending aorta, without rupture: Secondary | ICD-10-CM

## 2013-11-26 DIAGNOSIS — I712 Thoracic aortic aneurysm, without rupture: Secondary | ICD-10-CM

## 2013-11-28 ENCOUNTER — Encounter (INDEPENDENT_AMBULATORY_CARE_PROVIDER_SITE_OTHER): Payer: Self-pay | Admitting: General Surgery

## 2013-11-28 ENCOUNTER — Ambulatory Visit (INDEPENDENT_AMBULATORY_CARE_PROVIDER_SITE_OTHER): Payer: Medicare Other | Admitting: General Surgery

## 2013-11-28 ENCOUNTER — Encounter (INDEPENDENT_AMBULATORY_CARE_PROVIDER_SITE_OTHER): Payer: Self-pay

## 2013-11-28 VITALS — BP 110/76 | HR 66 | Temp 98.3°F | Resp 12 | Ht 62.0 in | Wt 137.4 lb

## 2013-11-28 DIAGNOSIS — C50919 Malignant neoplasm of unspecified site of unspecified female breast: Secondary | ICD-10-CM | POA: Diagnosis not present

## 2013-11-28 NOTE — Patient Instructions (Addendum)
Examination of your breast reconstruction and all the regional lymph nodes today is normal. There is no evidence of cancer.  Return to see Dr. Dalbert Batman in one year

## 2013-11-28 NOTE — Progress Notes (Signed)
Patient ID: [name], female   DOB: 1946/01/31, 68 y.o.   MRN: [id number]  History: [name] is a 68 y.o. female. She returns for long-term breast cancer followup.  This patient has a history of invasive ductal carcinoma left breast, stage TI B., N0. She ultimately has had bilateral total mastectomies and left axillary sentinel node biopsy (2009).   She had reconstruction. She was not satisfied with that. She has had a redo reconstruction by Dr. Adaline Sill at [facility] including nipple reconstruction and tattooing and she is very pleased with that. She also sees Gus Magrinat And she states that he may be discharging her from his care. She states that she wants to see me once a year.  She has no significant complaints. She occasionally has brief spontaneous pain in the left breast and chest wall. Otherwise stable. She also is being followed by Dr. Roxan Hockey for an aortic aneurysm and lung nodule and gets a CT scan of the chest annually. I told her to ask Dr. Koleen Nimrod if the breast reconstruction and chest wall anteriorly looks okay each time.  Past history, family history, social history, and review of systems are documented on the chart, unchanged, and noncontributory except as described above.  Exam: General: Patient looks well. No distress Neck: No adenopathy or mass. No jugular venous distention. Lungs: Clear to auscultation bilaterally Heart: Regular rate and rhythm. No murmur. No ectopy Bilateral mastectomy with bilateral implant based reconstruction and nipple reconstruction looks very good. Good symmetry. No palpable nodules or ulcerations. No axillary adenopathy. Good range of motion her shoulders.  Assessment  Invasive ductal carcinoma left breast, stage TI B. N0.  No evidence of recurrence following bilateral total mastectomies, SLN biopsy and multiple procedures for bilateral reconstruction.  Aortic aneurysm  Lung nodule   Plan  Continue followup with Dr. Merilynn Finland and  Dr. Warren Danes regarding cardiovascular and pulmonary issues  Annual CT with Dr. Roxan Hockey Return to see me in one year for physical exam.   Edsel Petrin. Dalbert Batman, M.D., [facility] General and Minimally invasive Surgery Breast and Colorectal Surgery Office:   [phone] Pager:   [phone]

## 2013-12-12 ENCOUNTER — Other Ambulatory Visit: Payer: Self-pay | Admitting: Thoracic Surgery (Cardiothoracic Vascular Surgery)

## 2013-12-12 DIAGNOSIS — I712 Thoracic aortic aneurysm, without rupture, unspecified: Secondary | ICD-10-CM | POA: Diagnosis not present

## 2013-12-12 LAB — CREATININE, SERUM: Creat: 0.61 mg/dL (ref 0.50–1.10)

## 2013-12-12 LAB — BUN: BUN: 15 mg/dL (ref 6–23)

## 2013-12-17 ENCOUNTER — Ambulatory Visit
Admission: RE | Admit: 2013-12-17 | Discharge: 2013-12-17 | Disposition: A | Discharge status: Home / Self Care | Payer: Medicare Other | Source: Ambulatory Visit | Attending: Thoracic Surgery (Cardiothoracic Vascular Surgery) | Admitting: Thoracic Surgery (Cardiothoracic Vascular Surgery)

## 2013-12-17 ENCOUNTER — Ambulatory Visit (INDEPENDENT_AMBULATORY_CARE_PROVIDER_SITE_OTHER): Payer: Medicare Other | Admitting: Thoracic Surgery (Cardiothoracic Vascular Surgery)

## 2013-12-17 ENCOUNTER — Encounter: Payer: Self-pay | Admitting: Thoracic Surgery (Cardiothoracic Vascular Surgery)

## 2013-12-17 VITALS — BP 122/79 | HR 75 | Resp 16 | Ht 62.5 in | Wt 135.0 lb

## 2013-12-17 DIAGNOSIS — I7121 Aneurysm of the ascending aorta, without rupture: Secondary | ICD-10-CM

## 2013-12-17 DIAGNOSIS — I712 Thoracic aortic aneurysm, without rupture, unspecified: Secondary | ICD-10-CM

## 2013-12-17 DIAGNOSIS — R911 Solitary pulmonary nodule: Secondary | ICD-10-CM

## 2013-12-17 DIAGNOSIS — Z853 Personal history of malignant neoplasm of breast: Secondary | ICD-10-CM | POA: Diagnosis not present

## 2013-12-17 MED ORDER — IOHEXOL 350 MG/ML SOLN
80.0000 mL | Freq: Once | INTRAVENOUS | Status: AC | PRN
  Administered 2013-12-17: 80 mL via INTRAVENOUS

## 2013-12-17 NOTE — Progress Notes (Signed)
HPI:  [name] returns for a scheduled now one year followup.  She is a 68 year old woman who was found to have a 4 cm of ascending aortic aneurysm back in 2010. We have been following her with the serial CT scans since that time. She also had a 5 mm lung nodule noted on her last scan in the superior segment of the right lower lobe.  Says that she's been having problems with chest pains. This only happens at night when she is lying in bed. She takes an aspirin and it has always resolved with time. She has not had any exertional chest pain, nor has she had the pain at rest while sitting up. She is very anxious about her aneurysm. She says that she has had some swelling in her ankles she notes when she wears tight socks. She's not had any shortness of breath.  Past Medical History  Diagnosis Date  . Thoracic aortic aneurysm     Dr. Roxan Hockey  . Breast cancer     s/p bilateral mastectomies  . Hiatal hernia   . HTN (hypertension)   . GERD (gastroesophageal reflux disease)     history of  . Kidney stones   . Osteoporosis       Current Outpatient Prescriptions  Medication Sig Dispense Refill  . acetaminophen (TYLENOL) 500 MG tablet Take 1,000 mg by mouth every 6 (six) hours as needed for pain.      Marland Kitchen ALPRAZolam (XANAX) 0.5 MG tablet Take 0.5 mg by mouth at bedtime as needed. For sleep      . amLODipine-benazepril (LOTREL) 5-10 MG per capsule Take 1 capsule by mouth daily.      . Cholecalciferol (VITAMIN D) 2000 UNITS CAPS Take 2,000 Units by mouth daily.        No current facility-administered medications for this visit.    Physical Exam BP 122/79  Pulse 75  Resp 16  Ht 5' 2.5" (1.588 m)  Wt 135 lb (61.236 kg)  BMI 24.28 kg/m2  SpO46 64% 68 year old woman in no acute distress Alert and oriented x3 Neurologic intact No carotid bruits Cardiac regular rate and rhythm normal S1 and S2 Lungs clear with equal breath sounds bilaterally 2+ pulses throughout, no peripheral  edema  Diagnostic Tests: CT ANGIOGRAPHY CHEST WITH CONTRAST  TECHNIQUE:  Multidetector CT imaging of the chest was performed using the  standard protocol during bolus administration of intravenous  contrast. Multiplanar CT image reconstructions and MIPs were  obtained to evaluate the vascular anatomy.  CONTRAST: 1mL OMNIPAQUE IOHEXOL 350 MG/ML SOLN  COMPARISON: 12/04/2012 and abdomen CT on 07/17/2012  FINDINGS:  Mild ascending thoracic aortic aneurysm measures 4.2 x 4.0 cm  compared to 4.0 x 3.9 cm previously. No evidence of descending  thoracic aortic aneurysm. No evidence of thoracic aortic dissection  or mediastinal hematoma. Previous Nissen fundoplication again noted.  Previous bilateral mastectomies noted. No evidence of axillary  lymphadenopathy or chest wall mass. No adenopathy seen elsewhere  within the thorax. No evidence of pleural or pericardial effusion.  Tiny nodular density in the posterior right upper lobe remains  stable, consistent with benign etiology. No suspicious pulmonary  nodules or masses are identified. No evidence of pulmonary  infiltrate or central endobronchial lesion.  Adrenal glands are normal in appearance. A benign hemangioma is  again noted in the posterior right hepatic lobe measuring  approximately 3.3 cm, which is unchanged compared to previous  abdomen CT.  Review of the MIP images confirms the above  findings.  IMPRESSION:  Mild ascending thoracic aortic aneurysm currently measures 4.2 cm  compared to 4.0 cm on prior study.  Incidentally noted stable benign right hepatic lobe hemangioma.  Electronically Signed  By: Earle Gell M.D.  On: 12/17/2013 10:47    Impression: 68 year old woman with a small ascending aortic aneurysm that we've been following for about 5 years. This had been stable 4 cm previously. On her CT today it does appear to be slightly larger measuring 4.2 x 4.0. There really is only a 1-2 mm change and is probably within or  close to the range of error of the study. It is still below the size criteria for surgery. I did discuss those criteria with her. Typically it's a 5.5 cm cut off for the ascending aorta, however given her small size I think 5 cm or possibly even 4.5 cm would be more appropriate in her case.  Her blood pressure is well-controlled.  Regarding her chest pain. Given its positional nature sounds for more likely to be reflux. She has had a fundoplication in the past, however it is possible to develop recurrent reflux after that. It does not sound anginal in nature and would not have anything to do with her ascending aorta.  Although was not mentioned report, comparing her CT to the one from 2014 is been no change in the nodule in the superior segment of the right lower lobe.  Plan:  I recommended that we repeat her CT scan in one year

## 2013-12-27 DIAGNOSIS — Z87442 Personal history of urinary calculi: Secondary | ICD-10-CM | POA: Insufficient documentation

## 2013-12-27 DIAGNOSIS — F419 Anxiety disorder, unspecified: Secondary | ICD-10-CM | POA: Insufficient documentation

## 2013-12-27 DIAGNOSIS — Z01818 Encounter for other preprocedural examination: Secondary | ICD-10-CM | POA: Diagnosis not present

## 2013-12-27 DIAGNOSIS — K219 Gastro-esophageal reflux disease without esophagitis: Secondary | ICD-10-CM | POA: Insufficient documentation

## 2013-12-27 DIAGNOSIS — I10 Essential (primary) hypertension: Secondary | ICD-10-CM | POA: Insufficient documentation

## 2014-01-01 IMAGING — CT CT ABD-PELV W/ CM
1 of 3 series · 13 of 32 positions shown, 18 images · IV contrast (omnipaque)
Comparison: 06/29/2012

CLINICAL DATA: Radiating to the back beginning this evening.  Some
nausea.

CT ABDOMEN AND PELVIS WITH CONTRAST
TECHNIQUE: Multidetector CT imaging of the abdomen and pelvis was
performed following the standard protocol during bolus
administration of intravenous contrast.
Contrast: 100mL OMNIPAQUE IOHEXOL 300 MG/ML  SOLN

[Series 2: abd/pel with · axial · 0.74mm/px · z∈[-460,-105]mm · 13 of 81 slices shown, 18 images]
[im 5/81  soft-tissue]
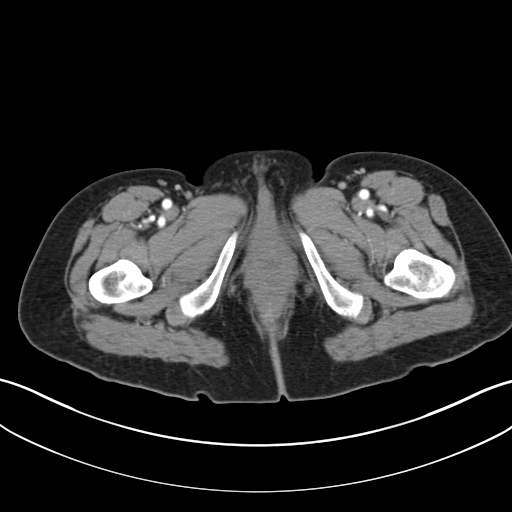
[im 5/81  bone]
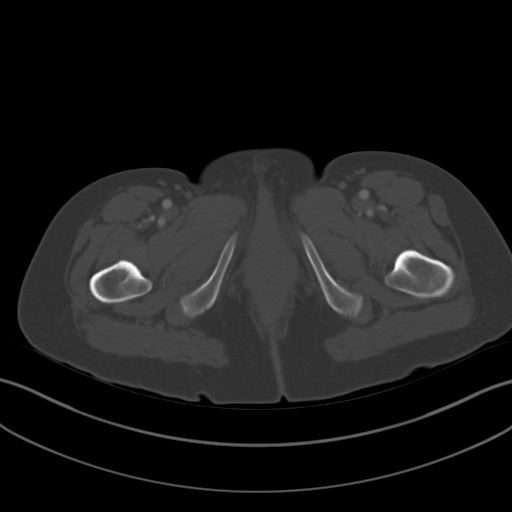
[im 15/81  soft-tissue]
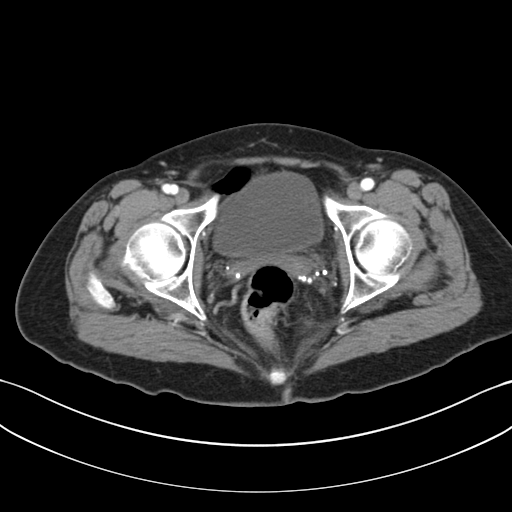
[im 19/81  soft-tissue]
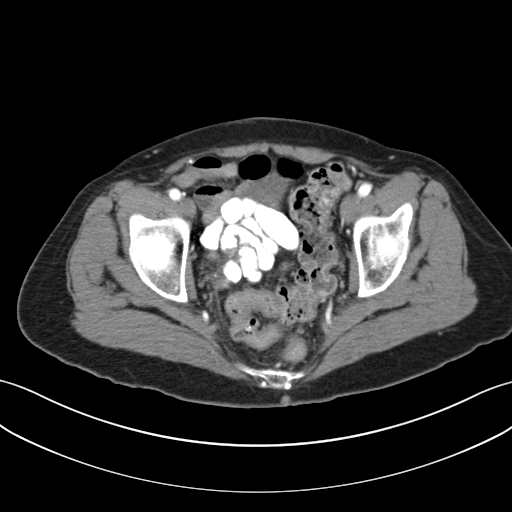
[im 24/81  soft-tissue]
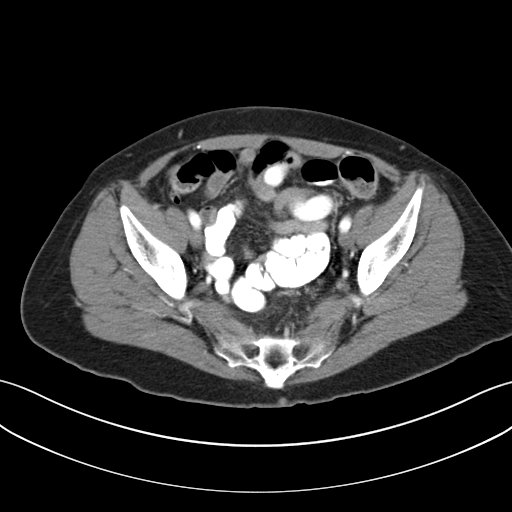
[im 33/81  soft-tissue]
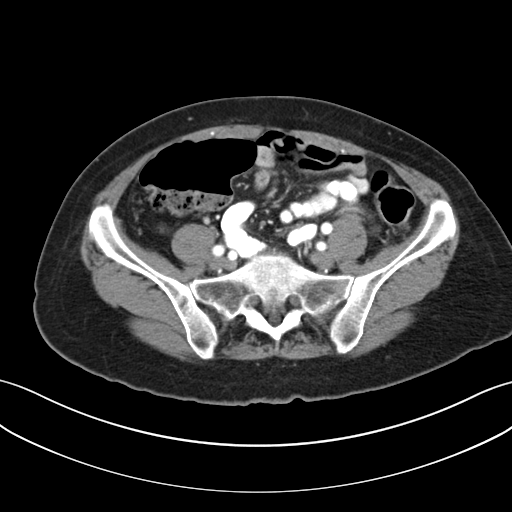
[im 38/81  soft-tissue]
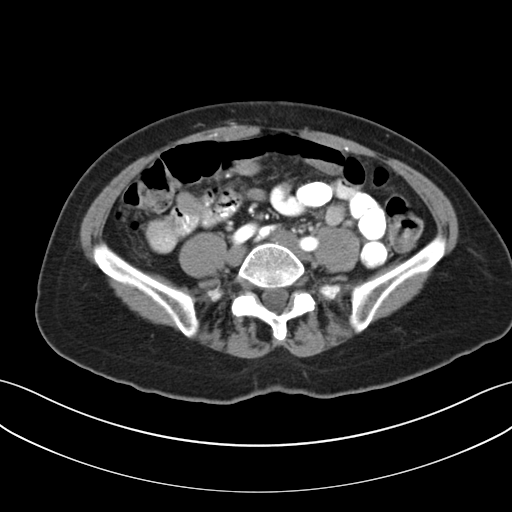
[im 43/81  soft-tissue]
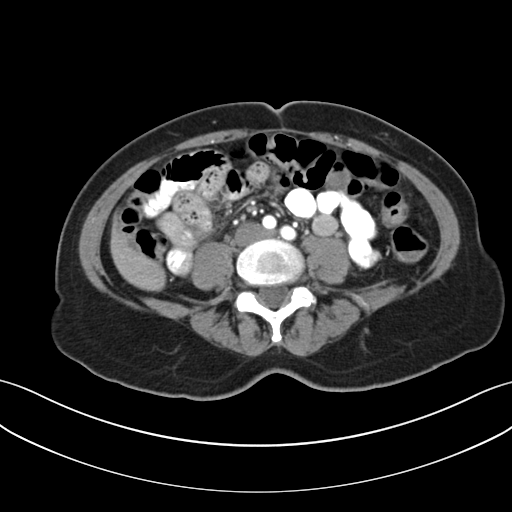
[im 52/81  soft-tissue]
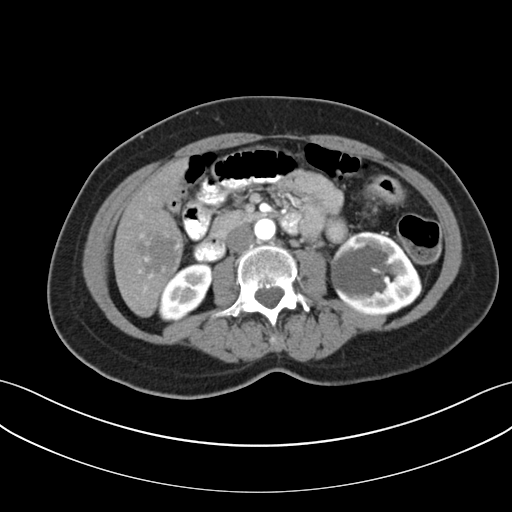
[im 57/81  soft-tissue]
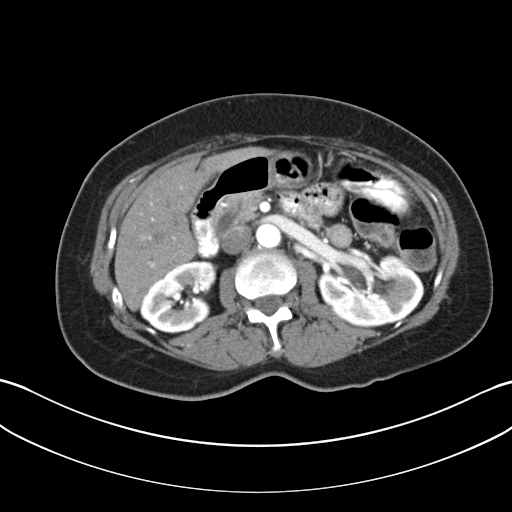
[im 57/81  bone]
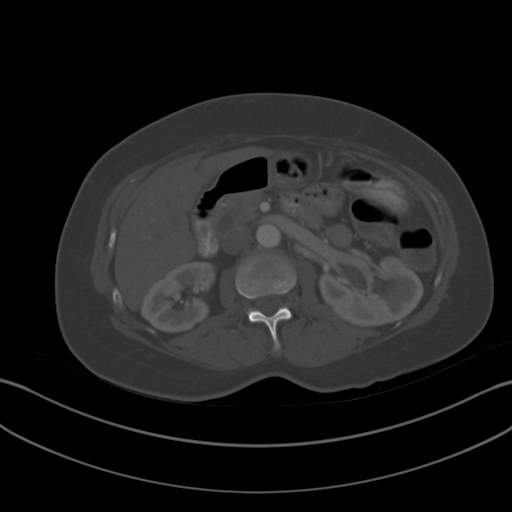
[im 62/81  soft-tissue]
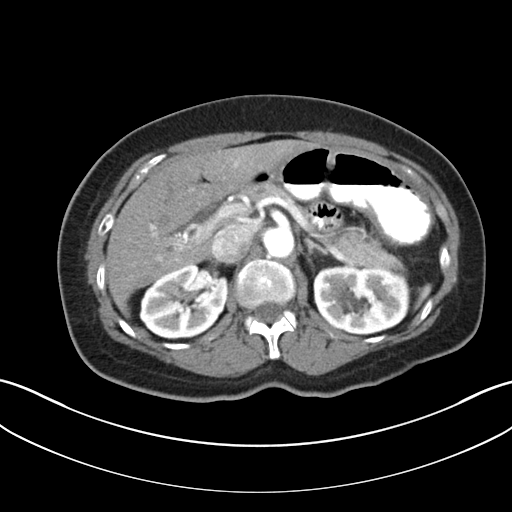
[im 62/81  lung]
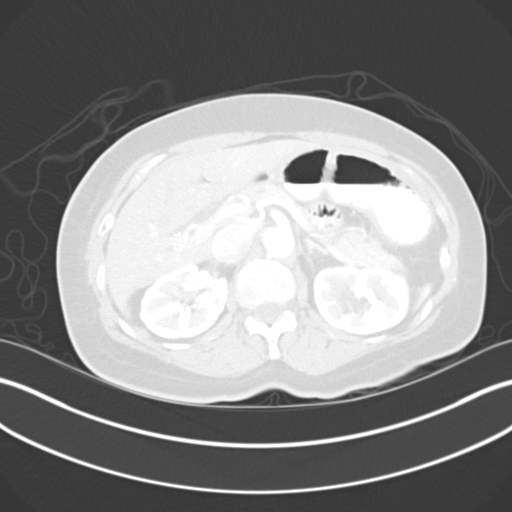
[im 66/81  lung]
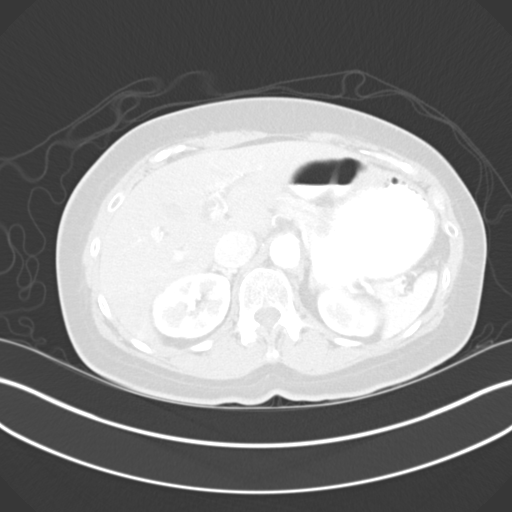
[im 71/81  soft-tissue]
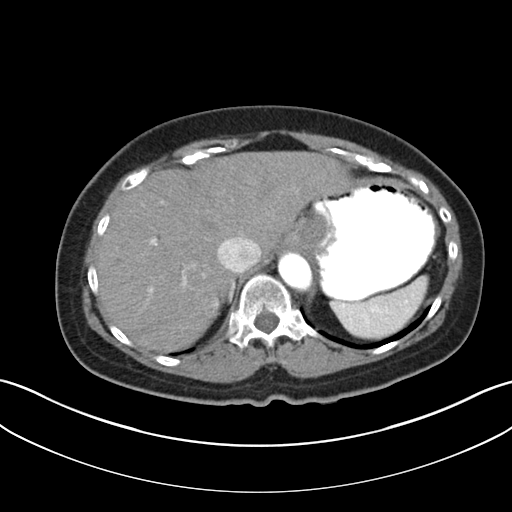
[im 71/81  lung]
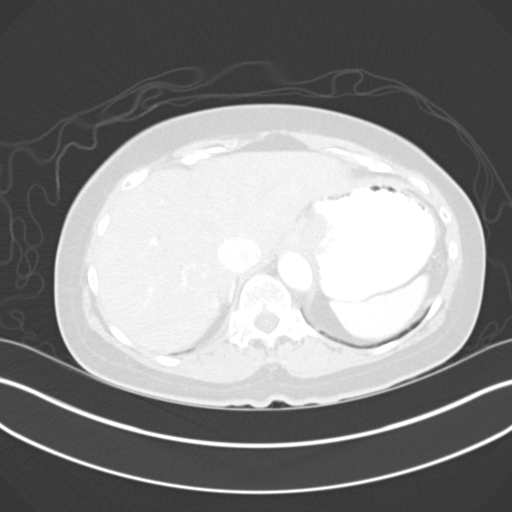
[im 76/81  soft-tissue]
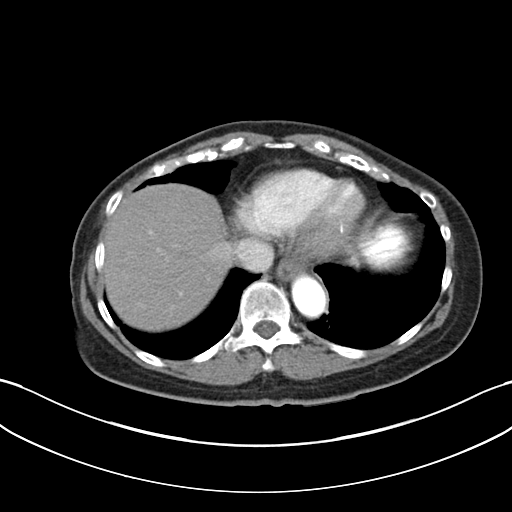
[im 76/81  lung]
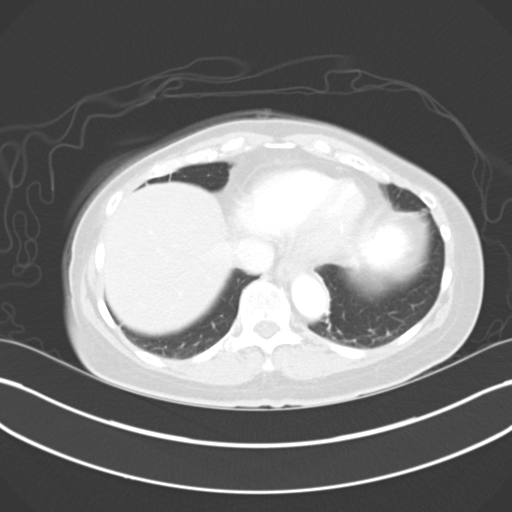

[13 of 32 positions shown; findings below may reference images not displayed]

FINDINGS: There is minimal subsegmental atelectasis at the lung
bases.  The heart is normal in size.  There are surgical vascular
clips in the epigastric region consistent with a previous
fundoplication.

A 4 cm angioma is stable arising from the inferior margin of the
posterior segment of the right lobe of the liver.  There is mild
intrahepatic bile duct dilation, chronic.  The liver is otherwise
unremarkable.  Normal spleen.  The gallbladder surgically absent.
The common bile duct is dilated to 1 cm and shows relatively abrupt
tapering in the pancreatic head.  This is a stable chronic finding.
The pancreas is unremarkable.  No adrenal masses.

Left renal cyst is stable.  There are nonobstructing stones in the
lower pole of the left kidney.  There are nonobstructing stones in
the right kidney.  There is a eighth 4 mm stone in the proximal
left ureter and mild dilation of the left intrarenal collecting
system.  This is new.  No right hydronephrosis.  The ureters are
otherwise unremarkable.  Normal bladder.

The uterus is surgically absent.  No pelvic masses.

There are sigmoid colon diverticula with a few  descending colon
diverticula.  No diverticulitis.  Bowel is otherwise unremarkable.
Normal appendix is not visualized but no evidence of appendicitis.
There is no significant bony abnormality.  No adenopathy and no
abnormal fluid collections.
IMPRESSION: 4 mm stone at the left ureteral pelvic junction causes mild left
hydronephrosis.

Bilateral nonobstructing intrarenal stones.  Stable left renal
cyst.

Other stable findings are also noted including a liver hemangioma,
changes from cholecystectomy and chronic common bile duct and
intrahepatic bile duct dilation as well as sigmoid diverticulosis
and changes from a hysterectomy.

## 2014-01-03 DIAGNOSIS — I10 Essential (primary) hypertension: Secondary | ICD-10-CM | POA: Diagnosis not present

## 2014-01-03 DIAGNOSIS — K219 Gastro-esophageal reflux disease without esophagitis: Secondary | ICD-10-CM | POA: Diagnosis not present

## 2014-01-03 DIAGNOSIS — R911 Solitary pulmonary nodule: Secondary | ICD-10-CM | POA: Diagnosis not present

## 2014-01-03 DIAGNOSIS — H02839 Dermatochalasis of unspecified eye, unspecified eyelid: Secondary | ICD-10-CM | POA: Diagnosis not present

## 2014-01-03 DIAGNOSIS — F411 Generalized anxiety disorder: Secondary | ICD-10-CM | POA: Diagnosis not present

## 2014-01-03 DIAGNOSIS — Z87442 Personal history of urinary calculi: Secondary | ICD-10-CM | POA: Diagnosis not present

## 2014-01-03 DIAGNOSIS — Z853 Personal history of malignant neoplasm of breast: Secondary | ICD-10-CM | POA: Diagnosis not present

## 2014-01-03 IMAGING — CR DG ABDOMEN 1V
1 series · 1 of 1 positions shown · non-contrast
Comparison: CT of 07/17/2012

CLINICAL DATA: Bilateral flank pain.  Greater on the left.  History
kidney stones.

ABDOMEN - 1 VIEW

[t abdomen supine]
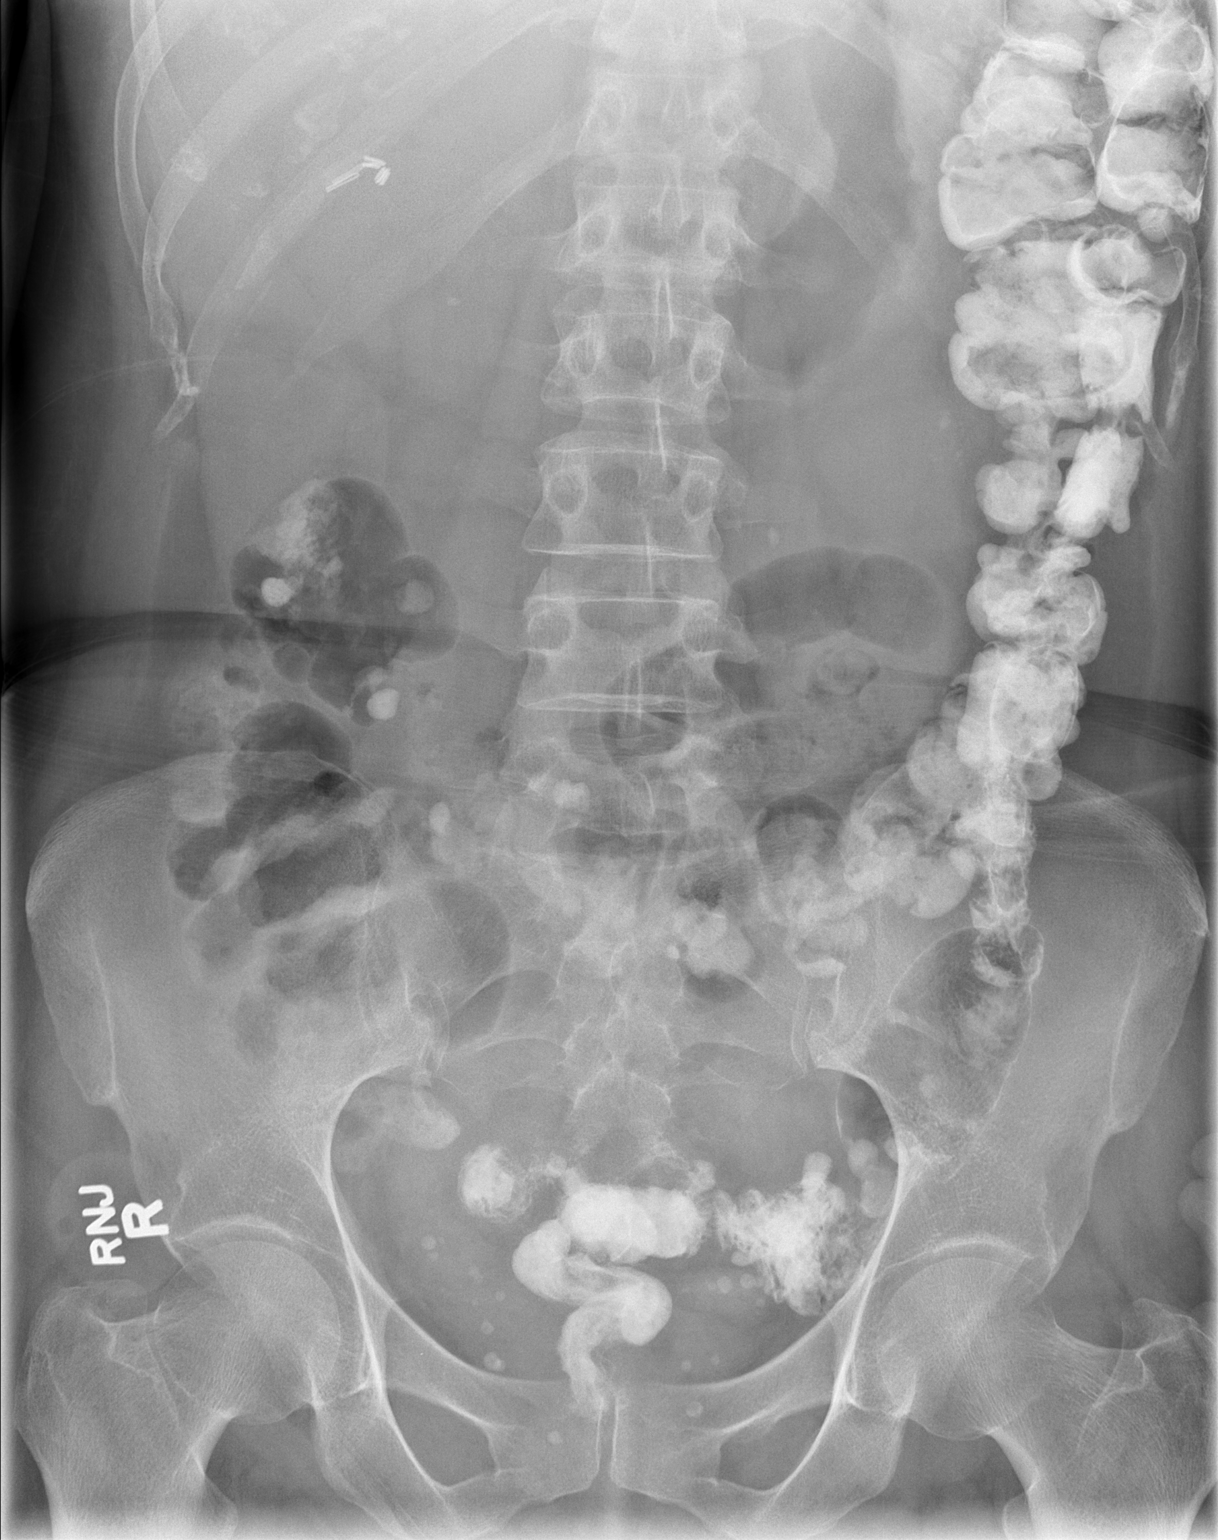

[1 of 1 positions shown; findings below may reference images not displayed]

FINDINGS: Contrast within the colon.  There is a inter/lower pole
left renal calculus.  A calcification in the left side of the
abdomen measures 5 mm and projects at a  level just below the left
transverse process of L3.  This likely represents mild inferior
progression of the left sided calculus.

There is a lower pole right renal calculus. Non-obstructive bowel
gas pattern.  Extensive phleboliths in the pelvis.  Cholecystectomy
clips.
IMPRESSION: 5 mm calcification likely represents interval movement of the left
sided stone into the mid-left ureter.

Bilateral renal calculi.

## 2014-02-01 DIAGNOSIS — B029 Zoster without complications: Secondary | ICD-10-CM | POA: Diagnosis not present

## 2014-02-01 DIAGNOSIS — L255 Unspecified contact dermatitis due to plants, except food: Secondary | ICD-10-CM | POA: Diagnosis not present

## 2014-02-01 DIAGNOSIS — R51 Headache: Secondary | ICD-10-CM | POA: Diagnosis not present

## 2014-02-04 DIAGNOSIS — B0233 Zoster keratitis: Secondary | ICD-10-CM | POA: Diagnosis not present

## 2014-02-04 DIAGNOSIS — H04129 Dry eye syndrome of unspecified lacrimal gland: Secondary | ICD-10-CM | POA: Diagnosis not present

## 2014-02-04 DIAGNOSIS — H26499 Other secondary cataract, unspecified eye: Secondary | ICD-10-CM | POA: Diagnosis not present

## 2014-02-04 DIAGNOSIS — H53009 Unspecified amblyopia, unspecified eye: Secondary | ICD-10-CM | POA: Diagnosis not present

## 2014-03-17 ENCOUNTER — Other Ambulatory Visit: Payer: Self-pay | Admitting: Internal Medicine

## 2014-03-17 DIAGNOSIS — R51 Headache: Principal | ICD-10-CM

## 2014-03-17 DIAGNOSIS — Z853 Personal history of malignant neoplasm of breast: Secondary | ICD-10-CM

## 2014-03-17 DIAGNOSIS — IMO0002 Reserved for concepts with insufficient information to code with codable children: Secondary | ICD-10-CM | POA: Diagnosis not present

## 2014-03-17 DIAGNOSIS — R519 Headache, unspecified: Secondary | ICD-10-CM

## 2014-03-17 DIAGNOSIS — I10 Essential (primary) hypertension: Secondary | ICD-10-CM | POA: Diagnosis not present

## 2014-03-24 ENCOUNTER — Ambulatory Visit
Admission: RE | Admit: 2014-03-24 | Discharge: 2014-03-24 | Disposition: A | Discharge status: Home / Self Care | Payer: Medicare Other | Source: Ambulatory Visit | Attending: Internal Medicine | Admitting: Internal Medicine

## 2014-03-24 DIAGNOSIS — G9389 Other specified disorders of brain: Secondary | ICD-10-CM | POA: Diagnosis not present

## 2014-03-24 DIAGNOSIS — Z853 Personal history of malignant neoplasm of breast: Secondary | ICD-10-CM

## 2014-03-24 DIAGNOSIS — R519 Headache, unspecified: Secondary | ICD-10-CM

## 2014-03-24 DIAGNOSIS — R51 Headache: Principal | ICD-10-CM

## 2014-03-24 MED ORDER — GADOBENATE DIMEGLUMINE 529 MG/ML IV SOLN
12.0000 mL | Freq: Once | INTRAVENOUS | Status: AC | PRN
  Administered 2014-03-24: 12 mL via INTRAVENOUS

## 2014-05-29 ENCOUNTER — Ambulatory Visit (INDEPENDENT_AMBULATORY_CARE_PROVIDER_SITE_OTHER): Payer: Medicare Other | Admitting: General Surgery

## 2014-07-02 DIAGNOSIS — M859 Disorder of bone density and structure, unspecified: Secondary | ICD-10-CM | POA: Diagnosis not present

## 2014-07-02 DIAGNOSIS — R8290 Unspecified abnormal findings in urine: Secondary | ICD-10-CM | POA: Diagnosis not present

## 2014-07-02 DIAGNOSIS — R8299 Other abnormal findings in urine: Secondary | ICD-10-CM | POA: Diagnosis not present

## 2014-07-02 DIAGNOSIS — I10 Essential (primary) hypertension: Secondary | ICD-10-CM | POA: Diagnosis not present

## 2014-07-02 DIAGNOSIS — M8580 Other specified disorders of bone density and structure, unspecified site: Secondary | ICD-10-CM | POA: Diagnosis not present

## 2014-07-09 DIAGNOSIS — Z1212 Encounter for screening for malignant neoplasm of rectum: Secondary | ICD-10-CM | POA: Diagnosis not present

## 2014-07-09 DIAGNOSIS — R51 Headache: Secondary | ICD-10-CM | POA: Diagnosis not present

## 2014-07-09 DIAGNOSIS — M8580 Other specified disorders of bone density and structure, unspecified site: Secondary | ICD-10-CM | POA: Diagnosis not present

## 2014-07-09 DIAGNOSIS — R312 Other microscopic hematuria: Secondary | ICD-10-CM | POA: Diagnosis not present

## 2014-07-09 DIAGNOSIS — Z79899 Other long term (current) drug therapy: Secondary | ICD-10-CM | POA: Diagnosis not present

## 2014-07-09 DIAGNOSIS — I712 Thoracic aortic aneurysm, without rupture: Secondary | ICD-10-CM | POA: Diagnosis not present

## 2014-07-09 DIAGNOSIS — C50919 Malignant neoplasm of unspecified site of unspecified female breast: Secondary | ICD-10-CM | POA: Diagnosis not present

## 2014-07-09 DIAGNOSIS — I10 Essential (primary) hypertension: Secondary | ICD-10-CM | POA: Diagnosis not present

## 2014-07-09 DIAGNOSIS — Z008 Encounter for other general examination: Secondary | ICD-10-CM | POA: Diagnosis not present

## 2014-07-10 ENCOUNTER — Telehealth: Payer: Self-pay | Admitting: *Deleted

## 2014-07-10 NOTE — Telephone Encounter (Signed)
Pt called to this RN to inquire " Dr Sharlett Iles found blood in my urine- do I need to see Dr Jana Hakim ".  Per further discussion- Konya states she does not have a UTI- " but I do have kidney stones but they do not seem to be bothering me " " but wrote on my sheet when I saw him- evaluate for cancer " " should I see Dr Jana Hakim so he can work me up ?"  This RN informed Rondia - work up needs to be done by a nephrologist for best outcome- with Fraser Din then stating " Dr Sharlett Iles has referred me to Dr McDiarmid, and I have and appointment on 07/31/2014. "  Plan per end of conversation is Wilberta will contact this RN to inform of above visit on 11/6 as well as request Dr Jana Hakim to be copied on reports.  No other needs at this time.

## 2014-07-18 ENCOUNTER — Other Ambulatory Visit (INDEPENDENT_AMBULATORY_CARE_PROVIDER_SITE_OTHER): Payer: Self-pay

## 2014-07-18 DIAGNOSIS — C50912 Malignant neoplasm of unspecified site of left female breast: Secondary | ICD-10-CM | POA: Diagnosis not present

## 2014-07-18 DIAGNOSIS — G8918 Other acute postprocedural pain: Secondary | ICD-10-CM

## 2014-07-18 DIAGNOSIS — R319 Hematuria, unspecified: Secondary | ICD-10-CM | POA: Diagnosis not present

## 2014-07-18 DIAGNOSIS — L299 Pruritus, unspecified: Secondary | ICD-10-CM | POA: Diagnosis not present

## 2014-07-18 DIAGNOSIS — I712 Thoracic aortic aneurysm, without rupture: Secondary | ICD-10-CM | POA: Diagnosis not present

## 2014-07-18 DIAGNOSIS — R0789 Other chest pain: Secondary | ICD-10-CM | POA: Diagnosis not present

## 2014-07-18 DIAGNOSIS — Z9889 Other specified postprocedural states: Secondary | ICD-10-CM | POA: Diagnosis not present

## 2014-07-22 DIAGNOSIS — H53022 Refractive amblyopia, left eye: Secondary | ICD-10-CM | POA: Diagnosis not present

## 2014-07-22 DIAGNOSIS — Z961 Presence of intraocular lens: Secondary | ICD-10-CM | POA: Diagnosis not present

## 2014-07-22 DIAGNOSIS — H26491 Other secondary cataract, right eye: Secondary | ICD-10-CM | POA: Diagnosis not present

## 2014-07-23 ENCOUNTER — Other Ambulatory Visit: Payer: Medicare Other

## 2014-07-25 ENCOUNTER — Other Ambulatory Visit: Payer: Medicare Other

## 2014-07-25 ENCOUNTER — Other Ambulatory Visit (INDEPENDENT_AMBULATORY_CARE_PROVIDER_SITE_OTHER): Payer: Self-pay | Admitting: General Surgery

## 2014-07-25 DIAGNOSIS — G8918 Other acute postprocedural pain: Secondary | ICD-10-CM

## 2014-07-25 DIAGNOSIS — R0789 Other chest pain: Principal | ICD-10-CM

## 2014-07-25 DIAGNOSIS — C50912 Malignant neoplasm of unspecified site of left female breast: Secondary | ICD-10-CM

## 2014-07-28 ENCOUNTER — Inpatient Hospital Stay: Admission: RE | Admit: 2014-07-28 | Payer: Medicare Other | Source: Ambulatory Visit

## 2014-08-01 ENCOUNTER — Ambulatory Visit
Admission: RE | Admit: 2014-08-01 | Discharge: 2014-08-01 | Disposition: A | Discharge status: Home / Self Care | Payer: Medicare Other | Source: Ambulatory Visit | Attending: General Surgery | Admitting: General Surgery

## 2014-08-01 DIAGNOSIS — I712 Thoracic aortic aneurysm, without rupture: Secondary | ICD-10-CM | POA: Diagnosis not present

## 2014-08-01 DIAGNOSIS — G8918 Other acute postprocedural pain: Secondary | ICD-10-CM

## 2014-08-01 DIAGNOSIS — R319 Hematuria, unspecified: Secondary | ICD-10-CM | POA: Diagnosis not present

## 2014-08-01 DIAGNOSIS — R0789 Other chest pain: Principal | ICD-10-CM

## 2014-08-01 DIAGNOSIS — R312 Other microscopic hematuria: Secondary | ICD-10-CM | POA: Diagnosis not present

## 2014-08-01 DIAGNOSIS — D1803 Hemangioma of intra-abdominal structures: Secondary | ICD-10-CM | POA: Diagnosis not present

## 2014-08-01 DIAGNOSIS — N200 Calculus of kidney: Secondary | ICD-10-CM | POA: Diagnosis not present

## 2014-08-01 DIAGNOSIS — C50912 Malignant neoplasm of unspecified site of left female breast: Secondary | ICD-10-CM

## 2014-08-01 MED ORDER — IOHEXOL 300 MG/ML  SOLN
75.0000 mL | Freq: Once | INTRAMUSCULAR | Status: AC | PRN
  Administered 2014-08-01: 75 mL via INTRAVENOUS

## 2014-08-07 DIAGNOSIS — N281 Cyst of kidney, acquired: Secondary | ICD-10-CM | POA: Diagnosis not present

## 2014-08-07 DIAGNOSIS — R312 Other microscopic hematuria: Secondary | ICD-10-CM | POA: Diagnosis not present

## 2014-08-07 DIAGNOSIS — N200 Calculus of kidney: Secondary | ICD-10-CM | POA: Diagnosis not present

## 2014-08-11 DIAGNOSIS — N281 Cyst of kidney, acquired: Secondary | ICD-10-CM | POA: Diagnosis not present

## 2014-08-11 DIAGNOSIS — R312 Other microscopic hematuria: Secondary | ICD-10-CM | POA: Diagnosis not present

## 2014-08-11 DIAGNOSIS — K449 Diaphragmatic hernia without obstruction or gangrene: Secondary | ICD-10-CM | POA: Diagnosis not present

## 2014-08-11 DIAGNOSIS — N200 Calculus of kidney: Secondary | ICD-10-CM | POA: Diagnosis not present

## 2014-08-15 DIAGNOSIS — N281 Cyst of kidney, acquired: Secondary | ICD-10-CM | POA: Diagnosis not present

## 2014-08-15 DIAGNOSIS — N200 Calculus of kidney: Secondary | ICD-10-CM | POA: Diagnosis not present

## 2014-08-15 DIAGNOSIS — R312 Other microscopic hematuria: Secondary | ICD-10-CM | POA: Diagnosis not present

## 2014-10-02 DIAGNOSIS — I712 Thoracic aortic aneurysm, without rupture: Secondary | ICD-10-CM | POA: Diagnosis not present

## 2014-10-02 DIAGNOSIS — R079 Chest pain, unspecified: Secondary | ICD-10-CM | POA: Diagnosis not present

## 2014-10-02 DIAGNOSIS — Z853 Personal history of malignant neoplasm of breast: Secondary | ICD-10-CM | POA: Diagnosis not present

## 2014-10-05 ENCOUNTER — Emergency Department (HOSPITAL_COMMUNITY)
Admission: EM | Admit: 2014-10-05 | Discharge: 2014-10-05 | Disposition: A | Discharge status: Home / Self Care | Payer: Medicare Other | Attending: Emergency Medicine | Admitting: Emergency Medicine

## 2014-10-05 ENCOUNTER — Encounter (HOSPITAL_COMMUNITY): Payer: Self-pay | Admitting: *Deleted

## 2014-10-05 DIAGNOSIS — I10 Essential (primary) hypertension: Secondary | ICD-10-CM | POA: Insufficient documentation

## 2014-10-05 DIAGNOSIS — N390 Urinary tract infection, site not specified: Secondary | ICD-10-CM

## 2014-10-05 DIAGNOSIS — N200 Calculus of kidney: Secondary | ICD-10-CM | POA: Diagnosis not present

## 2014-10-05 DIAGNOSIS — B9689 Other specified bacterial agents as the cause of diseases classified elsewhere: Secondary | ICD-10-CM | POA: Diagnosis not present

## 2014-10-05 DIAGNOSIS — M810 Age-related osteoporosis without current pathological fracture: Secondary | ICD-10-CM | POA: Diagnosis not present

## 2014-10-05 DIAGNOSIS — R109 Unspecified abdominal pain: Secondary | ICD-10-CM

## 2014-10-05 DIAGNOSIS — R10A1 Flank pain, right side: Secondary | ICD-10-CM

## 2014-10-05 DIAGNOSIS — Z853 Personal history of malignant neoplasm of breast: Secondary | ICD-10-CM | POA: Diagnosis not present

## 2014-10-05 DIAGNOSIS — Z79899 Other long term (current) drug therapy: Secondary | ICD-10-CM | POA: Insufficient documentation

## 2014-10-05 DIAGNOSIS — Z8719 Personal history of other diseases of the digestive system: Secondary | ICD-10-CM | POA: Insufficient documentation

## 2014-10-05 LAB — CBC WITH DIFFERENTIAL/PLATELET
Basophils Absolute: 0 10*3/uL (ref 0.0–0.1)
Basophils Relative: 0 % (ref 0–1)
Eosinophils Absolute: 0 10*3/uL (ref 0.0–0.7)
Eosinophils Relative: 0 % (ref 0–5)
HCT: 41.1 % (ref 36.0–46.0)
Hemoglobin: 13.5 g/dL (ref 12.0–15.0)
Lymphocytes Relative: 32 % (ref 12–46)
Lymphs Abs: 3.1 10*3/uL (ref 0.7–4.0)
MCH: 31.9 pg (ref 26.0–34.0)
MCHC: 32.8 g/dL (ref 30.0–36.0)
MCV: 97.2 fL (ref 78.0–100.0)
Monocytes Absolute: 0.9 10*3/uL (ref 0.1–1.0)
Monocytes Relative: 9 % (ref 3–12)
Neutro Abs: 5.7 10*3/uL (ref 1.7–7.7)
Neutrophils Relative %: 59 % (ref 43–77)
Platelets: 249 10*3/uL (ref 150–400)
RBC: 4.23 MIL/uL (ref 3.87–5.11)
RDW: 12.8 % (ref 11.5–15.5)
WBC: 9.7 10*3/uL (ref 4.0–10.5)

## 2014-10-05 LAB — URINE MICROSCOPIC-ADD ON

## 2014-10-05 LAB — I-STAT CHEM 8, ED
BUN: 18 mg/dL (ref 6–23)
Calcium, Ion: 1.24 mmol/L (ref 1.13–1.30)
Chloride: 105 mEq/L (ref 96–112)
Creatinine, Ser: 0.7 mg/dL (ref 0.50–1.10)
Glucose, Bld: 102 mg/dL — ABNORMAL HIGH (ref 70–99)
HCT: 45 % (ref 36.0–46.0)
Hemoglobin: 15.3 g/dL — ABNORMAL HIGH (ref 12.0–15.0)
Potassium: 4 mmol/L (ref 3.5–5.1)
Sodium: 142 mmol/L (ref 135–145)
TCO2: 24 mmol/L (ref 0–100)

## 2014-10-05 LAB — URINALYSIS, ROUTINE W REFLEX MICROSCOPIC
Bilirubin Urine: NEGATIVE
Glucose, UA: NEGATIVE mg/dL
KETONES UR: NEGATIVE mg/dL
Nitrite: POSITIVE — AB
PH: 6.5 (ref 5.0–8.0)
Protein, ur: 30 mg/dL — AB
Specific Gravity, Urine: 1.011 (ref 1.005–1.030)
UROBILINOGEN UA: 1 mg/dL (ref 0.0–1.0)

## 2014-10-05 MED ORDER — HYDROCODONE-ACETAMINOPHEN 5-325 MG PO TABS: 1.0000 | ORAL_TABLET | Freq: Four times a day (QID) | ORAL | Status: DC | PRN

## 2014-10-05 MED ORDER — MORPHINE SULFATE 4 MG/ML IJ SOLN
4.0000 mg | Freq: Once | INTRAMUSCULAR | Status: AC
  Administered 2014-10-05: 4 mg via INTRAVENOUS
  Filled 2014-10-05: qty 1

## 2014-10-05 MED ORDER — CIPROFLOXACIN HCL 500 MG PO TABS: 500.0000 mg | ORAL_TABLET | Freq: Two times a day (BID) | ORAL | Status: DC

## 2014-10-05 MED ORDER — OXYCODONE-ACETAMINOPHEN 5-325 MG PO TABS
2.0000 | ORAL_TABLET | Freq: Once | ORAL | Status: AC
  Administered 2014-10-05: 2 via ORAL
  Filled 2014-10-05: qty 2

## 2014-10-05 MED ORDER — CIPROFLOXACIN HCL 250 MG PO TABS: 250.0000 mg | ORAL_TABLET | Freq: Two times a day (BID) | ORAL | Status: DC

## 2014-10-05 NOTE — ED Provider Notes (Signed)
Patient seen and evaluated. Her symptoms are better controlled with IV pain medications. She follows with urology. Recent imaging as shown small stones bilaterally. She is comfortable with expectant management and pain control and fluid hydration urological follow-up. Her abdomen is benign to exam and. She does not appear toxic she is afebrile.  Tanna Furry, MD 10/05/14 [phone]

## 2014-10-05 NOTE — ED Notes (Signed)
PA at bedside Pt alert and oriented x4. Respirations even and unlabored, bilateral symmetrical rise and fall of chest. Skin warm and dry. In no acute distress. Denies needs.   

## 2014-10-05 NOTE — ED Notes (Signed)
Pt reports of kidney stones, had check up in Oct with urologist and at that time had 5 stones to pass. Reports urinary frequency x1 day, right flank pain since this afternoon. Pain 9/10. Blood in urine. Denies n/v/d.

## 2014-10-05 NOTE — ED Provider Notes (Signed)
CSN: [id number]     Arrival date & time 10/05/14  1628 History   First MD Initiated Contact with Patient 10/05/14 1655     Chief Complaint  Patient presents with  . Nephrolithiasis     (Consider location/radiation/quality/duration/timing/severity/associated sxs/prior Treatment) HPI Pt is a 69yo female with known kidney stones, presenting to ED with c/o right sided flank pain that has been intermittent for 2-3 days but states pain has become more persistent and increasingly worse today, associated with hematuria and urinary frequency.  Pain is constant, sharp and burning, 9/10 at worst. Reports no relief with ibuprofen. Pt is followed by Dr. Junious Silk, [facility], was seen in October 2015, diagnosed with 5 stones and was advised they would pass on their own. Pt has had lithotripsy in the past but no stents.  Reports passing 1 of the 5 stones about 1-2 weeks ago.  Pt had a lot of pressure at that time but no pain or hematuria.  She denies fever, chills, n/v/d.   Past Medical History  Diagnosis Date  . Thoracic aortic aneurysm     Dr. Roxan Hockey  . Breast cancer     s/p bilateral mastectomies  . Hiatal hernia   . HTN (hypertension)   . GERD (gastroesophageal reflux disease)     history of  . Kidney stones   . Osteoporosis    Past Surgical History  Procedure Laterality Date  . Mastectomy  03/2008    bilateral  . Gastric fundoplication    . Breast reconstruction      bilateral & implants  . Abdominal hysterectomy  1984  . Cesarean section  1977  . Hemorroidectomy    . Cholecystectomy    . Rotator cuff repair  10 years ago     Left   . Cystoscopy w/ ureteral stent placement  07/26/2012    Procedure: CYSTOSCOPY WITH RETROGRADE PYELOGRAM/URETERAL STENT PLACEMENT;  Surgeon: Claybon Jabs, MD;  Location: [facility];  Service: Urology;  Laterality: Left;  . Stone extraction with basket  07/26/2012    Procedure: STONE EXTRACTION WITH BASKET;  Surgeon: Claybon Jabs, MD;  Location: [facility]  [facility];  Service: Urology;  Laterality: Left;  . Ureteroscopy  07/26/2012    Procedure: URETEROSCOPY;  Surgeon: Claybon Jabs, MD;  Location: [facility];  Service: Urology;  Laterality: Left;   Family History  Problem Relation Age of Onset  . Diabetes Mother   . Heart disease Father   . Hyperlipidemia Sister   . Diabetes Sister   . Diabetes Sister   . Hyperlipidemia Sister    History  Substance Use Topics  . Smoking status: Never Smoker   . Smokeless tobacco: Never Used  . Alcohol Use: No   OB History    No data available     Review of Systems  Constitutional: Negative for fever and chills.  All other systems reviewed and are negative.     Allergies  Ceclor and Sulfa antibiotics  Home Medications   Prior to Admission medications   Medication Sig Start Date End Date Taking? Authorizing Provider  acetaminophen (TYLENOL) 500 MG tablet Take 1,000 mg by mouth every 6 (six) hours as needed for moderate pain (back pain).    Yes Historical Provider, MD  ALPRAZolam Duanne Moron) 0.5 MG tablet Take 0.5 mg by mouth at bedtime as needed for sleep (sleep). For sleep   Yes Historical Provider, MD  amLODipine-benazepril (LOTREL) 5-10 MG per capsule Take 1 capsule by mouth daily. 02/09/12  Yes Sarajane Jews  C Magrinat, MD  Cholecalciferol (VITAMIN D) 2000 UNITS CAPS Take 2,000 Units by mouth daily.    Yes Historical Provider, MD  ciprofloxacin (CIPRO) 250 MG tablet Take 1 tablet (250 mg total) by mouth 2 (two) times daily. 10/05/14   Noland Fordyce, PA-C  HYDROcodone-acetaminophen (NORCO/VICODIN) 5-325 MG per tablet Take 1-2 tablets by mouth every 6 (six) hours as needed for moderate pain or severe pain. 10/05/14   Noland Fordyce, PA-C   BP 114/68 mmHg  Pulse 71  Temp(Src) 98.7 F (37.1 C) (Oral)  Resp 16  SpO2 94% Physical Exam  Constitutional: She appears well-developed and well-nourished. She appears distressed.  Pt lying flat on exam bed, appears mildly uncomfortable.   HENT:  Head: Normocephalic  and atraumatic.  Eyes: Conjunctivae are normal. No scleral icterus.  Neck: Normal range of motion.  Cardiovascular: Normal rate, regular rhythm and normal heart sounds.   Pulmonary/Chest: Effort normal and breath sounds normal. No respiratory distress. She has no wheezes. She has no rales. She exhibits no tenderness.  Abdominal: Soft. Bowel sounds are normal. She exhibits no distension and no mass. There is no tenderness. There is no rebound, no guarding and no CVA tenderness.  Musculoskeletal: Normal range of motion.  Neurological: She is alert.  Skin: Skin is warm and dry. She is not diaphoretic.  Nursing note and vitals reviewed.   ED Course  Procedures (including critical care time) Labs Review Labs Reviewed  URINALYSIS, ROUTINE W REFLEX MICROSCOPIC - Abnormal; Notable for the following:    APPearance CLOUDY (*)    Hgb urine dipstick LARGE (*)    Protein, ur 30 (*)    Nitrite POSITIVE (*)    Leukocytes, UA LARGE (*)    All other components within normal limits  URINE MICROSCOPIC-ADD ON - Abnormal; Notable for the following:    Bacteria, UA FEW (*)    All other components within normal limits  I-STAT CHEM 8, ED - Abnormal; Notable for the following:    Glucose, Bld 102 (*)    Hemoglobin 15.3 (*)    All other components within normal limits  URINE CULTURE  CBC WITH DIFFERENTIAL    Imaging Review No results found.   EKG Interpretation None      MDM   Final diagnoses:  Right flank pain  Nephrolithiasis  UTI (lower urinary tract infection)   Pt is a 69yo female with known renal stones presenting to ED with c/o right flank pain, hematuria, and urinary frequency.  Due to reports of recent imaging by urology in Oct 2015, will not repeat today.   Labs: Cr: normal, UA: evidence of UTI positive for nitrites, WBC- TNTC.   Pain managed in ED with IV morphine.  Pt able to keep down PO fluids, able to void w/o difficulty. Will discharge home with pain management and cipro for  UTI. Urine culture sent.  Advised to f/u with urology next week for recheck of symptoms as needed. Pt hemodynamically stable for discharge home. Discussed pt with Dr. Jeneen Rinks who also examined pt, agrees with assessment and plan. Return precautions provided. Pt verbalized understanding and agreement with tx plan.     Noland Fordyce, PA-C 10/05/14 2246  Tanna Furry, MD 10/14/14 [phone]

## 2014-10-08 LAB — URINE CULTURE
Colony Count: 70000
Special Requests: NORMAL

## 2014-10-09 ENCOUNTER — Telehealth (HOSPITAL_BASED_OUTPATIENT_CLINIC_OR_DEPARTMENT_OTHER): Payer: Self-pay | Admitting: Emergency Medicine

## 2014-10-09 DIAGNOSIS — M8580 Other specified disorders of bone density and structure, unspecified site: Secondary | ICD-10-CM | POA: Diagnosis not present

## 2014-10-09 NOTE — Telephone Encounter (Signed)
Post ED Visit - Positive Culture Follow-up  Culture report reviewed by antimicrobial stewardship pharmacist: []  Wes [place], Pharm.D., BCPS []  Heide Guile, Pharm.D., BCPS []  Alycia Rossetti, Pharm.D., BCPS []  [place], Pharm.D., BCPS, AAHIVP []  Legrand [place], Pharm.D., BCPS, AAHIVP [x]  Isac Sarna, Pharm.D., BCPS  Positive urine culture E. Coli Treated with ciprofloxacin, organism sensitive to the same and no further patient follow-up is required at this time.  Hazle Nordmann 10/09/2014, 3:00 PM

## 2014-10-27 DIAGNOSIS — N200 Calculus of kidney: Secondary | ICD-10-CM | POA: Diagnosis not present

## 2014-10-27 DIAGNOSIS — R312 Other microscopic hematuria: Secondary | ICD-10-CM | POA: Diagnosis not present

## 2014-11-05 ENCOUNTER — Encounter (HOSPITAL_COMMUNITY): Payer: Self-pay

## 2014-11-05 ENCOUNTER — Ambulatory Visit (HOSPITAL_COMMUNITY)
Admission: RE | Admit: 2014-11-05 | Discharge: 2014-11-05 | Disposition: A | Discharge status: Home / Self Care | Payer: Medicare Other | Source: Ambulatory Visit | Attending: Internal Medicine | Admitting: Internal Medicine

## 2014-11-05 ENCOUNTER — Other Ambulatory Visit: Payer: Self-pay | Admitting: *Deleted

## 2014-11-05 ENCOUNTER — Other Ambulatory Visit (HOSPITAL_COMMUNITY): Payer: Self-pay | Admitting: Internal Medicine

## 2014-11-05 DIAGNOSIS — I712 Thoracic aortic aneurysm, without rupture: Secondary | ICD-10-CM

## 2014-11-05 DIAGNOSIS — M810 Age-related osteoporosis without current pathological fracture: Secondary | ICD-10-CM | POA: Diagnosis not present

## 2014-11-05 DIAGNOSIS — I7121 Aneurysm of the ascending aorta, without rupture: Secondary | ICD-10-CM

## 2014-11-05 MED ORDER — SODIUM CHLORIDE 0.9 % IV SOLN
Freq: Once | INTRAVENOUS | Status: AC
  Administered 2014-11-05: 14:00:00 via INTRAVENOUS

## 2014-11-05 MED ORDER — ZOLEDRONIC ACID 5 MG/100ML IV SOLN
5.0000 mg | Freq: Once | INTRAVENOUS | Status: AC
  Administered 2014-11-05: 5 mg via INTRAVENOUS
  Filled 2014-11-05: qty 100

## 2014-11-14 DIAGNOSIS — R358 Other polyuria: Secondary | ICD-10-CM | POA: Diagnosis not present

## 2014-11-14 DIAGNOSIS — R350 Frequency of micturition: Secondary | ICD-10-CM | POA: Diagnosis not present

## 2014-12-02 DIAGNOSIS — R312 Other microscopic hematuria: Secondary | ICD-10-CM | POA: Diagnosis not present

## 2014-12-02 DIAGNOSIS — N200 Calculus of kidney: Secondary | ICD-10-CM | POA: Diagnosis not present

## 2014-12-02 DIAGNOSIS — N281 Cyst of kidney, acquired: Secondary | ICD-10-CM | POA: Diagnosis not present

## 2014-12-17 ENCOUNTER — Other Ambulatory Visit: Payer: Self-pay | Admitting: *Deleted

## 2014-12-17 DIAGNOSIS — R911 Solitary pulmonary nodule: Secondary | ICD-10-CM | POA: Diagnosis not present

## 2014-12-19 LAB — BUN: BUN: 21 mg/dL (ref 6–23)

## 2014-12-19 LAB — CREATININE, SERUM: Creat: 0.6 mg/dL (ref 0.50–1.10)

## 2014-12-23 ENCOUNTER — Ambulatory Visit: Payer: Medicare Other | Admitting: Thoracic Surgery (Cardiothoracic Vascular Surgery)

## 2014-12-23 ENCOUNTER — Ambulatory Visit
Admission: RE | Admit: 2014-12-23 | Discharge: 2014-12-23 | Disposition: A | Discharge status: Home / Self Care | Payer: Medicare Other | Source: Ambulatory Visit | Attending: Thoracic Surgery (Cardiothoracic Vascular Surgery) | Admitting: Thoracic Surgery (Cardiothoracic Vascular Surgery)

## 2014-12-23 DIAGNOSIS — I7121 Aneurysm of the ascending aorta, without rupture: Secondary | ICD-10-CM

## 2014-12-23 DIAGNOSIS — I712 Thoracic aortic aneurysm, without rupture: Secondary | ICD-10-CM

## 2014-12-23 DIAGNOSIS — Z853 Personal history of malignant neoplasm of breast: Secondary | ICD-10-CM | POA: Diagnosis not present

## 2014-12-23 MED ORDER — IOHEXOL 350 MG/ML SOLN
75.0000 mL | Freq: Once | INTRAVENOUS | Status: AC | PRN
  Administered 2014-12-23: 75 mL via INTRAVENOUS

## 2014-12-30 ENCOUNTER — Ambulatory Visit: Payer: Medicare Other | Admitting: Thoracic Surgery (Cardiothoracic Vascular Surgery)

## 2014-12-31 ENCOUNTER — Ambulatory Visit (INDEPENDENT_AMBULATORY_CARE_PROVIDER_SITE_OTHER): Payer: Medicare Other | Admitting: Thoracic Surgery (Cardiothoracic Vascular Surgery)

## 2014-12-31 ENCOUNTER — Encounter: Payer: Self-pay | Admitting: Thoracic Surgery (Cardiothoracic Vascular Surgery)

## 2014-12-31 VITALS — BP 119/77 | HR 83 | Ht 62.0 in | Wt 138.0 lb

## 2014-12-31 DIAGNOSIS — Z853 Personal history of malignant neoplasm of breast: Secondary | ICD-10-CM | POA: Diagnosis not present

## 2014-12-31 DIAGNOSIS — R911 Solitary pulmonary nodule: Secondary | ICD-10-CM | POA: Diagnosis not present

## 2014-12-31 DIAGNOSIS — I712 Thoracic aortic aneurysm, without rupture: Secondary | ICD-10-CM

## 2014-12-31 DIAGNOSIS — I7121 Aneurysm of the ascending aorta, without rupture: Secondary | ICD-10-CM

## 2014-12-31 NOTE — Progress Notes (Signed)
[address]       [place] [address]             [phone]       HPI:  [name] returns today for a scheduled follow up visit regarding her ascending aortic aneurysm. This was first discovered in 2010 and was 4.0 cm at that time. She has been followed annually since then with no significant change in size.   She has been having some pain just to the left of her sternum. She was having more pain about 6 months ago and was concerned it was recurrent breast cancer. She saw Dr. Dalbert Batman and a CT showed no evidence of that. Aside from that pain she has had no other chest pain, pressure or tightness or shortness of breath.  She read her CT report and is very concerned that the aneurysm has gotten bigger.  Past Medical History  Diagnosis Date  . Thoracic aortic aneurysm     Dr. Roxan Hockey  . Breast cancer     s/p bilateral mastectomies  . Hiatal hernia   . HTN (hypertension)   . GERD (gastroesophageal reflux disease)     history of  . Kidney stones   . Osteoporosis       Current Outpatient Prescriptions  Medication Sig Dispense Refill  . acetaminophen (TYLENOL) 500 MG tablet Take 1,000 mg by mouth every 6 (six) hours as needed for moderate pain (back pain).     Marland Kitchen amLODipine-benazepril (LOTREL) 5-10 MG per capsule Take 1 capsule by mouth daily.    . cholecalciferol (VITAMIN D) 1000 UNITS tablet Take 1,000 Units by mouth daily.     No current facility-administered medications for this visit.    Physical Exam BP 119/77 mmHg  Pulse 83  Ht 5\' 2"  (1.575 m)  Wt 138 lb (62.596 kg)  BMI 25.23 kg/m2  SpO2 96% 69 yo woman in NAD A&O x 3, no focal neurologic deficit Neck - no carotid bruits Lungs clear bilaterally Cardiac RRR, normal S1 and S2, no murmur Distal pulses intact  Diagnostic Tests: CT ANGIOGRAPHY CHEST WITH CONTRAST  TECHNIQUE: Multidetector CT imaging of the chest was performed using the standard protocol during bolus administration  of intravenous contrast. Multiplanar CT image reconstructions and MIPs were obtained to evaluate the vascular anatomy.  CONTRAST: 27mL OMNIPAQUE IOHEXOL 350 MG/ML SOLN  COMPARISON: 08/01/2014  FINDINGS: Mild ascending aortic aneurysm noted, measuring 4.3 x 4.0 cm compared with 4.1 x 3.9 cm previously. Diameter at the annulus measures 2.8 cm. Diameter at the sino-tubular scratch head diameter at the sinus of Valsalva measures 44 mm. Diameter at the sino-tubular junction measures 30 mm. There is mild eccentric plaque in the distal descending thoracic aorta. No evidence of descending thoracic aortic aneurysm.  Scattered left anterior descending coronary artery calcifications. Heart is normal size. Small hiatal hernia. No mediastinal, hilar, or axillary adenopathy.  Linear areas of scarring in the lung bases bilaterally. No pleural effusions. Imaging into the upper abdomen shows no acute findings. No acute bony abnormality or focal bone lesion.  Review of the MIP images confirms the above findings.  IMPRESSION: Slight aneurysmal dilatation of the ascending thoracic aorta, measuring up to 4.3 cm compared with 4.1 cm previously. Recommend annual imaging followup by CTA or MRA. This recommendation follows 2010 ACCF/AHA/AATS/ACR/ASA/SCA/SCAI/SIR/STS/SVM Guidelines for the Diagnosis and Management of Patients with Thoracic Aortic Disease. Circulation. 2010; 121: H438-O875   Electronically Signed  By: Rolm Baptise M.D.  On: 12/23/2014  14:59  Impression: [name] is a 69 yo woman with an ascending aortic aneurysm. The CT report showed a slight increase in size(2 mm) in comparison to a CT in November of last year.  I reviewed the images and also showed them to Mr and Mrs Silverio. Looking at the scans side by side, I am not convinced there has been a significant increase in size. When comparing comparable sites (level of right PA crossing under) the aorta measures the  same. Despite showing her that she remains extremely anxious about the report. Given the possibility of growth, I think it would be best to rescan her in 6 months, rather than wait a full year.  Multiple small lung nodules are unchanged  Plan: Return in 6 months with CT angio of chest  Melrose Nakayama, MD [facility] [phone]

## 2015-01-06 DIAGNOSIS — Z1211 Encounter for screening for malignant neoplasm of colon: Secondary | ICD-10-CM | POA: Diagnosis not present

## 2015-03-29 ENCOUNTER — Encounter (HOSPITAL_COMMUNITY): Payer: Self-pay | Admitting: Emergency Medicine

## 2015-03-29 ENCOUNTER — Emergency Department (HOSPITAL_COMMUNITY)
Admission: EM | Admit: 2015-03-29 | Discharge: 2015-03-29 | Disposition: A | Discharge status: Home / Self Care | Payer: Medicare Other | Attending: Emergency Medicine | Admitting: Emergency Medicine

## 2015-03-29 DIAGNOSIS — L237 Allergic contact dermatitis due to plants, except food: Secondary | ICD-10-CM | POA: Diagnosis not present

## 2015-03-29 DIAGNOSIS — M810 Age-related osteoporosis without current pathological fracture: Secondary | ICD-10-CM | POA: Insufficient documentation

## 2015-03-29 DIAGNOSIS — Z87442 Personal history of urinary calculi: Secondary | ICD-10-CM | POA: Diagnosis not present

## 2015-03-29 DIAGNOSIS — Z8719 Personal history of other diseases of the digestive system: Secondary | ICD-10-CM | POA: Diagnosis not present

## 2015-03-29 DIAGNOSIS — Z79899 Other long term (current) drug therapy: Secondary | ICD-10-CM | POA: Insufficient documentation

## 2015-03-29 DIAGNOSIS — I10 Essential (primary) hypertension: Secondary | ICD-10-CM | POA: Insufficient documentation

## 2015-03-29 DIAGNOSIS — Z853 Personal history of malignant neoplasm of breast: Secondary | ICD-10-CM | POA: Diagnosis not present

## 2015-03-29 MED ORDER — DOXYCYCLINE HYCLATE 100 MG PO CAPS: 100.0000 mg | ORAL_CAPSULE | Freq: Two times a day (BID) | ORAL | Status: AC

## 2015-03-29 MED ORDER — PREDNISONE 10 MG (21) PO TBPK: 10.0000 mg | ORAL_TABLET | Freq: Every day | ORAL | Status: DC

## 2015-03-29 MED ORDER — DIPHENHYDRAMINE HCL 25 MG PO CAPS
25.0000 mg | ORAL_CAPSULE | Freq: Once | ORAL | Status: AC
  Administered 2015-03-29: 25 mg via ORAL
  Filled 2015-03-29: qty 1

## 2015-03-29 MED ORDER — DEXAMETHASONE SODIUM PHOSPHATE 10 MG/ML IJ SOLN
10.0000 mg | Freq: Once | INTRAMUSCULAR | Status: AC
  Administered 2015-03-29: 10 mg via INTRAMUSCULAR
  Filled 2015-03-29: qty 1

## 2015-03-29 NOTE — Discharge Instructions (Signed)
Poison Oak Take anabiotic's and prednisone as prescribed. Follow-up with your primary care physician. Poison oak is a rash caused by touching the leaves of the poison oak plant. You may have a rash with redness and itching. Sometimes, blisters appear and break open. Your eyes may get puffy (swollen). Poison oak often heals in 2 to 3 weeks without treatment.  HOME CARE  If you touch poison oak:  Wash your skin with soap and water right away. Wash under your fingernails. Do not rub the skin very hard.  Wash any clothes you were wearing.  Avoid poison oak in the future. Poison oak usually has 3 leaves on a stem.  Use medicines to help with itching as told by your doctor. Do not drive when you take this medicine.  Keep open sores dry, clean, and covered with a bandage and medicated cream, if needed.  Ask your doctor about medicine for children. GET HELP RIGHT AWAY IF:  You have open sores.  Redness spreads beyond the area of the rash.  There is yellowish white fluid (pus) coming from the rash.  Pain gets worse.  You have a temperature by mouth above 102 F (38.9 C), not controlled by medicine. MAKE SURE YOU:  Understand these instructions.  Will watch your condition.  Will get help right away if you are not doing well or get worse. Document Released: 10/15/2010 Document Revised: 12/05/2011 Document Reviewed: 10/15/2010 [facility] Patient Information 2015 [place], [place]. This information is not intended to replace advice given to you by your health care provider. Make sure you discuss any questions you have with your health care provider.

## 2015-03-29 NOTE — ED Notes (Signed)
Pt reports a 3 day hx of itching rash on arms and face. Also c/o multiple red, raised areas on legs and hand

## 2015-03-29 NOTE — ED Provider Notes (Signed)
CSN: [id number]     Arrival date & time 03/29/15  1312 History  This chart was scribed for Ottie Glazier, PA-C, working with Veryl Speak, MD by Steva Colder, ED Scribe. The patient was seen in room WTR6/WTR6 at 1:28 PM.    Chief Complaint  Patient presents with  . Poison Ivy    rash on arms arm face x 3 days  . Insect Bite    multiple red raised area x 3 days      The history is provided by the patient. No language interpreter was used.    [name] is a 69 y.o. female with a medical hx of HTN who presents to the Emergency Department complaining of poison ivy to bilateral arms, bilateral legs, neck, and face onset 3 days. Pt reports that at the time she was working out in the yard and that she has had poison ivy in the past. Pt notes that the rash on her hands are itching, but not on her legs. Pt is having associated symptoms of possible insect bite to the right thigh x 2 days. Pt reports that she sterilized a pin and scraped the top off the possible insect bite. She reports that she has not tried benadryl for the relief of her symptoms. She denies fever, chills, and any other symptoms. Pt denies hx of DM and she is allergic to sulfa and ceclor.   Past Medical History  Diagnosis Date  . Thoracic aortic aneurysm     Dr. Roxan Hockey  . Breast cancer     s/p bilateral mastectomies  . Hiatal hernia   . HTN (hypertension)   . GERD (gastroesophageal reflux disease)     history of  . Kidney stones   . Osteoporosis    Past Surgical History  Procedure Laterality Date  . Mastectomy  03/2008    bilateral  . Gastric fundoplication    . Breast reconstruction      bilateral & implants  . Abdominal hysterectomy  1984  . Cesarean section  1977  . Hemorroidectomy    . Cholecystectomy    . Rotator cuff repair  10 years ago     Left   . Cystoscopy w/ ureteral stent placement  07/26/2012    Procedure: CYSTOSCOPY WITH RETROGRADE PYELOGRAM/URETERAL STENT PLACEMENT;  Surgeon: Claybon Jabs, MD;  Location: [facility];  Service: Urology;  Laterality: Left;  . Stone extraction with basket  07/26/2012    Procedure: STONE EXTRACTION WITH BASKET;  Surgeon: Claybon Jabs, MD;  Location: [facility];  Service: Urology;  Laterality: Left;  . Ureteroscopy  07/26/2012    Procedure: URETEROSCOPY;  Surgeon: Claybon Jabs, MD;  Location: [facility];  Service: Urology;  Laterality: Left;   Family History  Problem Relation Age of Onset  . Diabetes Mother   . Heart disease Father   . Hyperlipidemia Sister   . Diabetes Sister   . Diabetes Sister   . Hyperlipidemia Sister    History  Substance Use Topics  . Smoking status: Never Smoker   . Smokeless tobacco: Never Used  . Alcohol Use: No   OB History    No data available     Review of Systems  Constitutional: Negative for fever and chills.  Respiratory: Negative for shortness of breath.   Musculoskeletal: Negative for joint swelling and gait problem.  Skin: Positive for color change and rash (bilateral legs, arms, neck, and face).      Allergies  Ceclor; Sulfa antibiotics;  and Codeine  Home Medications   Prior to Admission medications   Medication Sig Start Date End Date Taking? Authorizing Provider  acetaminophen (TYLENOL) 500 MG tablet Take 1,000 mg by mouth every 6 (six) hours as needed for moderate pain (back pain).     Historical Provider, MD  amLODipine-benazepril (LOTREL) 5-10 MG per capsule Take 1 capsule by mouth daily. 02/09/12   Chauncey Cruel, MD  cholecalciferol (VITAMIN D) 1000 UNITS tablet Take 1,000 Units by mouth daily.    Historical Provider, MD  doxycycline (VIBRAMYCIN) 100 MG capsule Take 1 capsule (100 mg total) by mouth 2 (two) times daily. 03/29/15 04/04/15  Mackenna Kamer Patel-Mills, PA-C  predniSONE (STERAPRED UNI-PAK 21 TAB) 10 MG (21) TBPK tablet Take 1 tablet (10 mg total) by mouth daily. Take 6 tabs by mouth daily  for 2 days, then 5 tabs for 2 days, then 4 tabs for 2 days, then 3 tabs for 2 days, 2 tabs for 2  days, then 1 tab by mouth daily for 2 days 03/29/15   Orvil Feil Patel-Mills, PA-C   BP 125/70 mmHg  Pulse 68  Temp(Src) 98.1 F (36.7 C) (Oral)  Resp 20  SpO2 98% Physical Exam  Constitutional: She is oriented to person, place, and time. She appears well-developed and well-nourished. No distress.  HENT:  Head: Normocephalic and atraumatic.  Eyes: Conjunctivae and EOM are normal.  Neck: Neck supple. No tracheal deviation present.  Cardiovascular: Normal rate.   Pulmonary/Chest: Effort normal. No respiratory distress.  Musculoskeletal: Normal range of motion.  Neurological: She is alert and oriented to person, place, and time.  Skin: Skin is warm and dry. There is erythema.  Round raised circular erythematous rash on the right and left side of knee measuring approximately 2 cm in diameter each with central scab. She also has one that is 1 cm in diameter on the right proximal first metacarpal. She has diffuse linear macular raised areas on the face, neck, bilateral arms.  Psychiatric: She has a normal mood and affect. Her behavior is normal.  Nursing note and vitals reviewed.   ED Course  Procedures (including critical care time) DIAGNOSTIC STUDIES: Oxygen Saturation is 98% on RA, nl by my interpretation.    COORDINATION OF CARE: 1:36 PM-Discussed treatment plan with pt at bedside and pt agreed to plan.   Labs Review Labs Reviewed - No data to display  Imaging Review No results found.   EKG Interpretation None      MDM   Final diagnoses:  Poison oak   Vitals are stable. Her rash resembles a linear pattern that is consistent with poison oak. Patient states she has steroid cream at home. Patient is allergic to cephalosporin and sulfa drugs. I have given her prednisone as well as doxycycline to cover for infection. The appearance of the rash on the knees looks like it has been scratched and has scabs over it. There is no target lesion. She can follow-up with her PCP and verbally  agrees with the plan. Medications  diphenhydrAMINE (BENADRYL) capsule 25 mg (25 mg Oral Given 03/29/15 1415)  dexamethasone (DECADRON) injection 10 mg (10 mg Intramuscular Given 03/29/15 1416)   I personally performed the services described in this documentation, which was scribed in my presence. The recorded information has been reviewed and is accurate.    Esther Bradstreet, PA-C 03/29/15 1452  Veryl Speak, MD 03/29/15 [phone]

## 2015-06-25 DIAGNOSIS — M859 Disorder of bone density and structure, unspecified: Secondary | ICD-10-CM | POA: Diagnosis not present

## 2015-06-25 DIAGNOSIS — I10 Essential (primary) hypertension: Secondary | ICD-10-CM | POA: Diagnosis not present

## 2015-07-02 DIAGNOSIS — I10 Essential (primary) hypertension: Secondary | ICD-10-CM | POA: Diagnosis not present

## 2015-07-02 DIAGNOSIS — M859 Disorder of bone density and structure, unspecified: Secondary | ICD-10-CM | POA: Diagnosis not present

## 2015-07-02 DIAGNOSIS — R3129 Other microscopic hematuria: Secondary | ICD-10-CM | POA: Diagnosis not present

## 2015-07-02 DIAGNOSIS — R8290 Unspecified abnormal findings in urine: Secondary | ICD-10-CM | POA: Diagnosis not present

## 2015-07-02 DIAGNOSIS — Z6824 Body mass index (BMI) 24.0-24.9, adult: Secondary | ICD-10-CM | POA: Diagnosis not present

## 2015-07-02 DIAGNOSIS — Z0000 Encounter for general adult medical examination without abnormal findings: Secondary | ICD-10-CM | POA: Diagnosis not present

## 2015-07-02 DIAGNOSIS — C50919 Malignant neoplasm of unspecified site of unspecified female breast: Secondary | ICD-10-CM | POA: Diagnosis not present

## 2015-07-02 DIAGNOSIS — I712 Thoracic aortic aneurysm, without rupture: Secondary | ICD-10-CM | POA: Diagnosis not present

## 2015-07-02 DIAGNOSIS — N390 Urinary tract infection, site not specified: Secondary | ICD-10-CM | POA: Diagnosis not present

## 2015-07-02 DIAGNOSIS — Z1389 Encounter for screening for other disorder: Secondary | ICD-10-CM | POA: Diagnosis not present

## 2015-07-09 ENCOUNTER — Other Ambulatory Visit: Payer: Self-pay | Admitting: Thoracic Surgery (Cardiothoracic Vascular Surgery)

## 2015-07-09 DIAGNOSIS — I712 Thoracic aortic aneurysm, without rupture: Secondary | ICD-10-CM

## 2015-07-09 DIAGNOSIS — I7121 Aneurysm of the ascending aorta, without rupture: Secondary | ICD-10-CM

## 2015-07-17 DIAGNOSIS — C50912 Malignant neoplasm of unspecified site of left female breast: Secondary | ICD-10-CM | POA: Diagnosis not present

## 2015-07-17 DIAGNOSIS — Z9889 Other specified postprocedural states: Secondary | ICD-10-CM | POA: Diagnosis not present

## 2015-07-17 DIAGNOSIS — R0789 Other chest pain: Secondary | ICD-10-CM | POA: Diagnosis not present

## 2015-07-17 DIAGNOSIS — I712 Thoracic aortic aneurysm, without rupture: Secondary | ICD-10-CM | POA: Diagnosis not present

## 2015-07-21 ENCOUNTER — Encounter: Payer: Self-pay | Admitting: Thoracic Surgery (Cardiothoracic Vascular Surgery)

## 2015-07-21 ENCOUNTER — Ambulatory Visit (INDEPENDENT_AMBULATORY_CARE_PROVIDER_SITE_OTHER): Payer: Medicare Other | Admitting: Thoracic Surgery (Cardiothoracic Vascular Surgery)

## 2015-07-21 ENCOUNTER — Other Ambulatory Visit: Payer: Self-pay | Admitting: Thoracic Surgery (Cardiothoracic Vascular Surgery)

## 2015-07-21 ENCOUNTER — Encounter (INDEPENDENT_AMBULATORY_CARE_PROVIDER_SITE_OTHER): Payer: Self-pay

## 2015-07-21 ENCOUNTER — Ambulatory Visit
Admission: RE | Admit: 2015-07-21 | Discharge: 2015-07-21 | Disposition: A | Discharge status: Home / Self Care | Payer: Medicare Other | Source: Ambulatory Visit | Attending: Thoracic Surgery (Cardiothoracic Vascular Surgery) | Admitting: Thoracic Surgery (Cardiothoracic Vascular Surgery)

## 2015-07-21 VITALS — BP 133/79 | HR 67 | Resp 16 | Ht 62.5 in | Wt 138.0 lb

## 2015-07-21 DIAGNOSIS — I712 Thoracic aortic aneurysm, without rupture, unspecified: Secondary | ICD-10-CM

## 2015-07-21 DIAGNOSIS — R911 Solitary pulmonary nodule: Secondary | ICD-10-CM | POA: Diagnosis not present

## 2015-07-21 DIAGNOSIS — I7121 Aneurysm of the ascending aorta, without rupture: Secondary | ICD-10-CM

## 2015-07-21 DIAGNOSIS — Z853 Personal history of malignant neoplasm of breast: Secondary | ICD-10-CM

## 2015-07-21 MED ORDER — IOPAMIDOL (ISOVUE-370) INJECTION 76%
75.0000 mL | Freq: Once | INTRAVENOUS | Status: AC | PRN
  Administered 2015-07-21: 75 mL via INTRAVENOUS

## 2015-07-21 NOTE — Progress Notes (Signed)
[address]       [place] [address]             [phone]       HPI:  [name] returns for a scheduled 6 month follow-up visit regarding her ascending aneurysm.  [name] is a 69 year old woman who was found to have a 4 cm ascending aneurysm back in 2010. She has been followed since that time with serial CT scans. The aneurysm had remained stable for years. On her last CT in March the radiologist felt the aneurysm had increased in size by a couple of millimeters. Because of the possible increase I recommended that she return in 6 months rather than waiting a year for her next scan.  In the interim since her last visit she continues to have some vague chest pain. She describes this as strong. She denies a pressure or tightness or burning quality to the pain. It does not radiate. It usually happens when she lies down at night. She does not have chest pain with exertion.  Past Medical History  Diagnosis Date  . Thoracic aortic aneurysm (HCC)     Dr. Roxan Hockey  . Breast cancer [facility])     s/p bilateral mastectomies  . Hiatal hernia   . HTN (hypertension)   . GERD (gastroesophageal reflux disease)     history of  . Kidney stones   . Osteoporosis      Current Outpatient Prescriptions  Medication Sig Dispense Refill  . acetaminophen (TYLENOL) 500 MG tablet Take 1,000 mg by mouth every 6 (six) hours as needed for moderate pain (back pain).     Marland Kitchen amLODipine-benazepril (LOTREL) 5-10 MG per capsule Take 1 capsule by mouth daily.    Marland Kitchen ALPRAZolam (XANAX) 0.5 MG tablet     . Cholecalciferol (VITAMIN D3) 2000 UNITS capsule Take by mouth.     No current facility-administered medications for this visit.    Physical Exam BP 133/79 mmHg  Pulse 67  Resp 16  Ht 5' 2.5" (1.588 m)  Wt 138 lb (62.596 kg)  BMI 24.82 kg/m2  SpO15 28% 69 year old woman in no acute distress Alert and oriented 3 with no motor deficits No carotid bruits Cardiac regular rate  and rhythm normal S1 and S2, no murmur Lungs clear with equal breath sounds bilaterally Pulses 2+ and symmetrical  Diagnostic Tests: I personally reviewed the CT angiogram of the chest and compared to the previous films. I see no change in the aneurysm or the lung nodules. Official reading is pending.  Impression:  [name] is a 69 year old woman with a known 4.2 cm ascending aortic aneurysm and multiple small lung nodules, the largest being 5.6 mm in the right lower lobe. She continues to have some chest pain which comes on primarily at night. It is not associated with nausea or vomiting. It is not burning in nature. It does not radiate to her neck, back, or arms. It is not exertional in nature. I do not think the pain is related to her aorta in any way. It is not a particularly good story for angina. She may have reflux, but it is not classic for that either.  Ascending aneurysm - The CT scan has not been officially read as of yet, but in comparison to the CT from March of this year I see no significant change.  Lung nodules- unchanged and stable dating back to 2010. Nonsmoker  Plan:  Return in 1 year with  CT angio of chest  I spent 10 minutes face to face with [name], > 50% was spent in counseling Melrose Nakayama, MD [facility] [phone]  Addendum  CT ANGIOGRAPHY CHEST WITH CONTRAST  TECHNIQUE: Multidetector CT imaging of the chest was performed using the standard protocol during bolus administration of intravenous contrast. Multiplanar CT image reconstructions and MIPs were obtained to evaluate the vascular anatomy.  CONTRAST: 75 mL Isovue 370 IV  COMPARISON: 12/23/2014 and additional prior CTA examinations dating back to 2010.  FINDINGS: The ascending thoracic aorta again demonstrates top normal caliber/mild aneurysmal dilatation. By measurements today, the maximum diameter of the ascending thoracic aorta is 4.0 cm.  The diameter at the sinuses of Valsalva is 3.9 cm. The proximal arch measures 3.2 cm. The distal arch measures 2.8 cm. The descending thoracic aorta measures 2.3 cm. No evidence of dissection. Proximal great vessels are normally patent with separate origin of the left vertebral artery again noted off of the arch.  The heart size is normal. Stable calcified plaque is noted in the distribution of the LAD. Central pulmonary arteries are normal in caliber. No evidence of pleural or pericardial fluid. Lungs show no infiltrates, edema, nodules or pneumothorax. No pleural fluid identified. No lymphadenopathy is identified in the chest. Airways are normally patent. Bilateral breast implants have a stable appearance. Visualized upper abdominal structures are unremarkable with evidence of prior gastric/ hiatus surgery. No bony abnormalities.  Review of the MIP images confirms the above findings.  IMPRESSION: No change in top normal caliber/mild aneurysmal dilatation of the ascending thoracic aorta. Measurements today are more consistent with the prior CTA examinations which demonstrated diameter of 4 cm. The maximum diameter obtainable by CTA today is 4 cm.  Recommend annual imaging followup by CTA or MRA. This recommendation follows 2010 ACCF/AHA/AATS/ACR/ASA/SCA/SCAI/SIR/STS/SVM Guidelines for the Diagnosis and Management of Patients with Thoracic Aortic Disease. Circulation. 2010; 121: [id number]   Electronically Signed  By: Aletta Edouard M.D.  Revonda Standard Roxan Hockey, MD [facility] [phone]

## 2015-07-28 DIAGNOSIS — M859 Disorder of bone density and structure, unspecified: Secondary | ICD-10-CM | POA: Diagnosis not present

## 2015-10-28 DIAGNOSIS — M859 Disorder of bone density and structure, unspecified: Secondary | ICD-10-CM | POA: Diagnosis not present

## 2015-11-03 DIAGNOSIS — H04123 Dry eye syndrome of bilateral lacrimal glands: Secondary | ICD-10-CM | POA: Diagnosis not present

## 2015-11-03 DIAGNOSIS — Z961 Presence of intraocular lens: Secondary | ICD-10-CM | POA: Diagnosis not present

## 2015-11-03 DIAGNOSIS — H26491 Other secondary cataract, right eye: Secondary | ICD-10-CM | POA: Diagnosis not present

## 2015-11-03 DIAGNOSIS — H40013 Open angle with borderline findings, low risk, bilateral: Secondary | ICD-10-CM | POA: Diagnosis not present

## 2015-11-18 ENCOUNTER — Other Ambulatory Visit (HOSPITAL_COMMUNITY): Payer: Self-pay | Admitting: Internal Medicine

## 2015-11-24 ENCOUNTER — Encounter (HOSPITAL_COMMUNITY): Payer: Self-pay

## 2015-11-24 ENCOUNTER — Ambulatory Visit (HOSPITAL_COMMUNITY)
Admission: RE | Admit: 2015-11-24 | Discharge: 2015-11-24 | Disposition: A | Discharge status: Home / Self Care | Payer: Medicare Other | Source: Ambulatory Visit | Attending: Internal Medicine | Admitting: Internal Medicine

## 2015-11-24 DIAGNOSIS — M810 Age-related osteoporosis without current pathological fracture: Secondary | ICD-10-CM | POA: Diagnosis not present

## 2015-11-24 MED ORDER — SODIUM CHLORIDE 0.9 % IV SOLN
INTRAVENOUS | Status: AC
  Administered 2015-11-24: 08:00:00 via INTRAVENOUS

## 2015-11-24 MED ORDER — ZOLEDRONIC ACID 5 MG/100ML IV SOLN
5.0000 mg | Freq: Once | INTRAVENOUS | Status: AC
  Administered 2015-11-24: 5 mg via INTRAVENOUS
  Filled 2015-11-24: qty 100

## 2015-11-24 NOTE — Discharge Instructions (Signed)

## 2015-12-31 DIAGNOSIS — J209 Acute bronchitis, unspecified: Secondary | ICD-10-CM | POA: Diagnosis not present

## 2016-05-09 DIAGNOSIS — H04123 Dry eye syndrome of bilateral lacrimal glands: Secondary | ICD-10-CM | POA: Diagnosis not present

## 2016-05-09 DIAGNOSIS — H40013 Open angle with borderline findings, low risk, bilateral: Secondary | ICD-10-CM | POA: Diagnosis not present

## 2016-05-14 ENCOUNTER — Encounter (HOSPITAL_BASED_OUTPATIENT_CLINIC_OR_DEPARTMENT_OTHER): Payer: Self-pay | Admitting: *Deleted

## 2016-05-14 ENCOUNTER — Emergency Department (HOSPITAL_BASED_OUTPATIENT_CLINIC_OR_DEPARTMENT_OTHER): Payer: Medicare Other

## 2016-05-14 ENCOUNTER — Emergency Department (HOSPITAL_BASED_OUTPATIENT_CLINIC_OR_DEPARTMENT_OTHER)
Admission: EM | Admit: 2016-05-14 | Discharge: 2016-05-14 | Disposition: A | Discharge status: Home / Self Care | Payer: Medicare Other | Attending: Emergency Medicine | Admitting: Emergency Medicine

## 2016-05-14 DIAGNOSIS — I10 Essential (primary) hypertension: Secondary | ICD-10-CM | POA: Diagnosis not present

## 2016-05-14 DIAGNOSIS — Y929 Unspecified place or not applicable: Secondary | ICD-10-CM | POA: Diagnosis not present

## 2016-05-14 DIAGNOSIS — S92511A Displaced fracture of proximal phalanx of right lesser toe(s), initial encounter for closed fracture: Secondary | ICD-10-CM | POA: Diagnosis not present

## 2016-05-14 DIAGNOSIS — Y999 Unspecified external cause status: Secondary | ICD-10-CM | POA: Insufficient documentation

## 2016-05-14 DIAGNOSIS — S92514A Nondisplaced fracture of proximal phalanx of right lesser toe(s), initial encounter for closed fracture: Secondary | ICD-10-CM | POA: Insufficient documentation

## 2016-05-14 DIAGNOSIS — Z853 Personal history of malignant neoplasm of breast: Secondary | ICD-10-CM | POA: Insufficient documentation

## 2016-05-14 DIAGNOSIS — W228XXA Striking against or struck by other objects, initial encounter: Secondary | ICD-10-CM | POA: Insufficient documentation

## 2016-05-14 DIAGNOSIS — S92501A Displaced unspecified fracture of right lesser toe(s), initial encounter for closed fracture: Secondary | ICD-10-CM

## 2016-05-14 DIAGNOSIS — Y939 Activity, unspecified: Secondary | ICD-10-CM | POA: Insufficient documentation

## 2016-05-14 DIAGNOSIS — S99921A Unspecified injury of right foot, initial encounter: Secondary | ICD-10-CM | POA: Diagnosis present

## 2016-05-14 DIAGNOSIS — W2203XA Walked into furniture, initial encounter: Secondary | ICD-10-CM | POA: Insufficient documentation

## 2016-05-14 MED ORDER — HYDROCODONE-ACETAMINOPHEN 5-325 MG PO TABS
1.0000 | ORAL_TABLET | Freq: Once | ORAL | Status: AC
  Administered 2016-05-14: 1 via ORAL
  Filled 2016-05-14: qty 1

## 2016-05-14 MED ORDER — HYDROCODONE-ACETAMINOPHEN 5-325 MG PO TABS: 1.0000 | ORAL_TABLET | Freq: Four times a day (QID) | ORAL | 0 refills | Status: DC | PRN

## 2016-05-14 NOTE — ED Provider Notes (Signed)
[place] DEPT [facility] Provider Note   CSN: [id number] Arrival date & time: 05/14/16  2150   By signing my name below, I, Ephriam Jenkins, attest that this documentation has been prepared under the direction and in the presence of Debroah Baller NP Electronically Signed: Ephriam Jenkins, ED Scribe. 05/14/16. 11:30 PM.   History   Chief Complaint Chief Complaint  Patient presents with  . Toe Injury    HPI HPI Comments: [name] is a 70 y.o. female who presents to the Emergency Department complaining of right small toe pain s/p an injury that occurred two hours ago. Pt states she hit her right small toe on her sofa 2 hours ago. Pt has not taken anything for pain PTA. Pt's PCP is Dr. Cay Schillings.   The history is provided by the patient. No language interpreter was used.   Past Medical History:  Diagnosis Date  . Breast cancer [facility])    s/p bilateral mastectomies  . GERD (gastroesophageal reflux disease)    history of  . Hiatal hernia   . HTN (hypertension)   . Kidney stones   . Osteoporosis   . Thoracic aortic aneurysm [facility])    Dr. Roxan Hockey    Patient Active Problem List   Diagnosis Date Noted  . Ascending aortic aneurysm ([place]) 12/04/2012  . Lung nodule 12/04/2012  . Breast cancer ([place]) 02/09/2012    Past Surgical History:  Procedure Laterality Date  . ABDOMINAL HYSTERECTOMY  1984  . BREAST RECONSTRUCTION     bilateral & implants  . [place]  . CHOLECYSTECTOMY    . CYSTOSCOPY W/ URETERAL STENT PLACEMENT  07/26/2012   Procedure: CYSTOSCOPY WITH RETROGRADE PYELOGRAM/URETERAL STENT PLACEMENT;  Surgeon: Claybon Jabs, MD;  Location: [facility];  Service: Urology;  Laterality: Left;  Marland Kitchen GASTRIC FUNDOPLICATION    . HEMORROIDECTOMY    . MASTECTOMY  03/2008   bilateral  . ROTATOR CUFF REPAIR  10 years ago    Left   . STONE EXTRACTION WITH BASKET  07/26/2012   Procedure: STONE EXTRACTION WITH BASKET;  Surgeon: Claybon Jabs, MD;  Location: [facility];  Service:  Urology;  Laterality: Left;  . URETEROSCOPY  07/26/2012   Procedure: URETEROSCOPY;  Surgeon: Claybon Jabs, MD;  Location: [facility];  Service: Urology;  Laterality: Left;    OB History    No data available       Home Medications    Prior to Admission medications   Medication Sig Start Date End Date Taking? Authorizing Provider  acetaminophen (TYLENOL) 500 MG tablet Take 1,000 mg by mouth every 6 (six) hours as needed for moderate pain (back pain).     Historical Provider, MD  ALPRAZolam Duanne Moron) 0.5 MG tablet  05/14/15   Historical Provider, MD  amLODipine-benazepril (LOTREL) 5-10 MG per capsule Take 1 capsule by mouth daily. 02/09/12   Chauncey Cruel, MD  Cholecalciferol (VITAMIN D3) 2000 UNITS capsule Take by mouth.    Historical Provider, MD  HYDROcodone-acetaminophen (NORCO/VICODIN) 5-325 MG tablet Take 1 tablet by mouth every 6 (six) hours as needed. 05/14/16   Chrisotpher Rivero Bunnie Pion, NP    Family History Family History  Problem Relation Age of Onset  . Diabetes Mother   . Heart disease Father   . Hyperlipidemia Sister   . Diabetes Sister   . Diabetes Sister   . Hyperlipidemia Sister    Social History Social History  Substance Use Topics  . Smoking status: Never Smoker  . Smokeless tobacco:  Never Used  . Alcohol use No   Allergies   Ceclor [cefaclor]; Sulfa antibiotics; and Codeine  Review of Systems Review of Systems  Musculoskeletal: Positive for arthralgias (right little toe).  Neurological: Negative for numbness.  All other systems reviewed and are negative.  Physical Exam Updated Vital Signs BP 109/68 (BP Location: Right Arm)   Pulse (!) 52   Temp 97.9 F (36.6 C) (Oral)   Resp 20   Ht 5' 2.5" (1.588 m)   Wt 62.6 kg   SpO2 96%   BMI 24.84 kg/m   Physical Exam  Constitutional: She is oriented to person, place, and time. She appears well-developed and well-nourished. No distress.  HENT:  Head: Normocephalic and atraumatic.  Eyes: EOM are normal.  Neck:  Neck supple.  Cardiovascular: Bradycardia present.   Pulmonary/Chest: Effort normal.  Musculoskeletal: She exhibits tenderness.       Right foot: There is decreased range of motion, tenderness, bony tenderness and swelling. There is normal capillary refill, no deformity and no laceration.       Feet:  Adequate circulation, pedal pulse 2+ TTP in any ROM to the right little toe  Neurological: She is alert and oriented to person, place, and time.  Skin: Skin is warm and dry. She is not diaphoretic.  Psychiatric: She has a normal mood and affect. Judgment normal.  Nursing note and vitals reviewed.  ED Treatments / Results  DIAGNOSTIC STUDIES: Oxygen Saturation is 96% on RA, normal by my interpretation.  COORDINATION OF CARE: 11:15 PM-Will order medication for pain. Discussed treatment plan with pt at bedside and pt agreed to plan.   Labs (all labs ordered are listed, but only abnormal results are displayed) Labs Reviewed - No data to display Radiology Dg Foot Complete Right  Result Date: 05/14/2016 CLINICAL DATA:  Trauma to the fourth and fifth metatarsals after caking suffer up. EXAM: RIGHT FOOT COMPLETE - 3+ VIEW COMPARISON:  None. FINDINGS: There is an oblique fracture of the distal diaphysis and metaphysis of the proximal phalanx of the small toe. No extension to the articular surface. Other bones of the foot are normal. IMPRESSION: Nondisplaced fracture of the proximal phalanx of the small toe. Electronically Signed   By: Nelson Chimes M.D.   On: 05/14/2016 22:43   Procedures Procedures (including critical care time)  Medications Ordered in ED Medications  HYDROcodone-acetaminophen (NORCO/VICODIN) 5-325 MG per tablet 1 tablet (1 tablet Oral Given 05/14/16 2317)    Initial Impression / Assessment and Plan / ED Course  I have reviewed the triage vital signs and the nursing notes.  Pertinent maging results that were available during my care of the patient were reviewed by me and  considered in my medical decision making (see chart for details).  Clinical Course   Buddy tape toes, right, post op shoe, pain management, ice and elevation, f/u with ortho.   Final Clinical Impressions(s) / ED Diagnoses  70 y.o. female with pain and swelling of the right little toe s/p injury stable for d/c without focal neuro deficits. Discussed with the patient clinical and x-ray findings and plan of care and all questioned fully answered. She will return if any problems arise.  Final diagnoses:  Fracture of fifth toe, right, closed, initial encounter    New Prescriptions New Prescriptions   HYDROCODONE-ACETAMINOPHEN (NORCO/VICODIN) 5-325 MG TABLET    Take 1 tablet by mouth every 6 (six) hours as needed.   I personally performed the services described in this documentation, which was  scribed in my presence. The recorded information has been reviewed and is accurate.      [address] [place], [place] 05/14/16 [place], MD 05/15/16 [phone]

## 2016-05-14 NOTE — ED Notes (Signed)
Pt given d/c instructions. Rx x 1 with narc prec instructions. Verbalizes understabding. No questions.

## 2016-05-14 NOTE — ED Triage Notes (Signed)
Pt states she hit her right little toe on the sofa about 2 hours ago.

## 2016-05-16 DIAGNOSIS — S92511A Displaced fracture of proximal phalanx of right lesser toe(s), initial encounter for closed fracture: Secondary | ICD-10-CM | POA: Diagnosis not present

## 2016-05-16 DIAGNOSIS — M79674 Pain in right toe(s): Secondary | ICD-10-CM | POA: Diagnosis not present

## 2016-05-27 DIAGNOSIS — S99821D Other specified injuries of right foot, subsequent encounter: Secondary | ICD-10-CM | POA: Diagnosis not present

## 2016-05-27 DIAGNOSIS — M79674 Pain in right toe(s): Secondary | ICD-10-CM | POA: Diagnosis not present

## 2016-05-27 DIAGNOSIS — S92511D Displaced fracture of proximal phalanx of right lesser toe(s), subsequent encounter for fracture with routine healing: Secondary | ICD-10-CM | POA: Diagnosis not present

## 2016-06-16 ENCOUNTER — Other Ambulatory Visit: Payer: Self-pay | Admitting: Thoracic Surgery (Cardiothoracic Vascular Surgery)

## 2016-06-16 DIAGNOSIS — I712 Thoracic aortic aneurysm, without rupture, unspecified: Secondary | ICD-10-CM

## 2016-07-05 DIAGNOSIS — R8299 Other abnormal findings in urine: Secondary | ICD-10-CM | POA: Diagnosis not present

## 2016-07-05 DIAGNOSIS — N390 Urinary tract infection, site not specified: Secondary | ICD-10-CM | POA: Diagnosis not present

## 2016-07-05 DIAGNOSIS — I10 Essential (primary) hypertension: Secondary | ICD-10-CM | POA: Diagnosis not present

## 2016-07-05 DIAGNOSIS — M859 Disorder of bone density and structure, unspecified: Secondary | ICD-10-CM | POA: Diagnosis not present

## 2016-07-11 ENCOUNTER — Ambulatory Visit
Admission: RE | Admit: 2016-07-11 | Discharge: 2016-07-11 | Disposition: A | Discharge status: Home / Self Care | Payer: Medicare Other | Source: Ambulatory Visit | Attending: Thoracic Surgery (Cardiothoracic Vascular Surgery) | Admitting: Thoracic Surgery (Cardiothoracic Vascular Surgery)

## 2016-07-11 DIAGNOSIS — I712 Thoracic aortic aneurysm, without rupture, unspecified: Secondary | ICD-10-CM

## 2016-07-11 MED ORDER — IOPAMIDOL (ISOVUE-370) INJECTION 76%
75.0000 mL | Freq: Once | INTRAVENOUS | Status: AC | PRN
  Administered 2016-07-11: 75 mL via INTRAVENOUS

## 2016-07-12 ENCOUNTER — Ambulatory Visit (INDEPENDENT_AMBULATORY_CARE_PROVIDER_SITE_OTHER): Payer: Medicare Other | Admitting: Thoracic Surgery (Cardiothoracic Vascular Surgery)

## 2016-07-12 ENCOUNTER — Other Ambulatory Visit: Payer: Medicare Other

## 2016-07-12 ENCOUNTER — Encounter: Payer: Self-pay | Admitting: Thoracic Surgery (Cardiothoracic Vascular Surgery)

## 2016-07-12 VITALS — BP 127/77 | HR 77 | Resp 20 | Ht 62.5 in | Wt 142.0 lb

## 2016-07-12 DIAGNOSIS — I7121 Aneurysm of the ascending aorta, without rupture: Secondary | ICD-10-CM

## 2016-07-12 DIAGNOSIS — I10 Essential (primary) hypertension: Secondary | ICD-10-CM | POA: Diagnosis not present

## 2016-07-12 DIAGNOSIS — C50919 Malignant neoplasm of unspecified site of unspecified female breast: Secondary | ICD-10-CM | POA: Diagnosis not present

## 2016-07-12 DIAGNOSIS — E784 Other hyperlipidemia: Secondary | ICD-10-CM | POA: Diagnosis not present

## 2016-07-12 DIAGNOSIS — I712 Thoracic aortic aneurysm, without rupture: Secondary | ICD-10-CM

## 2016-07-12 DIAGNOSIS — N200 Calculus of kidney: Secondary | ICD-10-CM | POA: Diagnosis not present

## 2016-07-12 DIAGNOSIS — M859 Disorder of bone density and structure, unspecified: Secondary | ICD-10-CM | POA: Diagnosis not present

## 2016-07-12 DIAGNOSIS — R0789 Other chest pain: Secondary | ICD-10-CM | POA: Diagnosis not present

## 2016-07-12 DIAGNOSIS — Z6820 Body mass index (BMI) 20.0-20.9, adult: Secondary | ICD-10-CM | POA: Diagnosis not present

## 2016-07-12 DIAGNOSIS — Z1389 Encounter for screening for other disorder: Secondary | ICD-10-CM | POA: Diagnosis not present

## 2016-07-12 DIAGNOSIS — Z0000 Encounter for general adult medical examination without abnormal findings: Secondary | ICD-10-CM | POA: Diagnosis not present

## 2016-07-12 DIAGNOSIS — R3129 Other microscopic hematuria: Secondary | ICD-10-CM | POA: Diagnosis not present

## 2016-07-12 NOTE — Progress Notes (Signed)
[address]       [place] [address]             [phone]       HPI: [name] returns for her one-year follow-up regarding her ascending aneurysm.  [name] is a 70 year old woman who was found back in 2009 to have an ascending aneurysm. The radiologist measured it 0.6 x 4 cm at that time. Then by 2010 it was 4 cm. In March 2016 the radiologist felt the aneurysm had increased in size by couple of millimeters. There was still no indication for surgery and I disagreed with the radiologist's interpretation and felt like it was unchanged. I recommended a 6 month interval follow-up. That was done in October 2016. The aneurysm was unchanged. Multiple small lung nodules were also unchanged.  In the interim since her last visit she remains very anxious about the aneurysm. She is afraid that it will burst. She complains of pain in her upper chest is very vague about the exact quality of the pain. She also complains of pain in her back between her shoulder blades which she says is a sharp stabbing pain. Neither are related to exertion on a consistent basis. She is scheduled to have a stress test and has an appointment coming up with Dr. Einar Gip.  Past Medical History:  Diagnosis Date  . Breast cancer [facility])    s/p bilateral mastectomies  . GERD (gastroesophageal reflux disease)    history of  . Hiatal hernia   . HTN (hypertension)   . Kidney stones   . Osteoporosis   . Thoracic aortic aneurysm (HCC)    Dr. Roxan Hockey    Current Outpatient Prescriptions  Medication Sig Dispense Refill  . acetaminophen (TYLENOL) 500 MG tablet Take 1,000 mg by mouth every 6 (six) hours as needed for moderate pain (back pain).     Marland Kitchen amLODipine-benazepril (LOTREL) 5-10 MG per capsule Take 1 capsule by mouth daily.    . Cholecalciferol (VITAMIN D3) 2000 UNITS capsule Take by mouth.     No current facility-administered medications for this visit.     Physical Exam BP 127/77 (BP  Location: Right Arm, Patient Position: Sitting, Cuff Size: Small)   Pulse 77   Resp 20   Ht 5' 2.5" (1.588 m)   Wt 142 lb (64.4 kg)   SpO2 97% Comment: RA  BMI 25.56 kg/m  Anxious 70 year old woman in no acute distress Alert and oriented 3 with no focal deficits No cervical or supraclavicular adenopathy. No carotid bruits. Cardiac regular rate and rhythm normal S1 and S2, no rubs or murmurs Lungs clear with equal breath sounds bilaterally  Distal pulses 2+ and symmetric  Diagnostic Tests: CT ANGIOGRAPHY CHEST WITH CONTRAST  TECHNIQUE: Multidetector CT imaging of the chest was performed using the standard protocol during bolus administration of intravenous contrast. Multiplanar CT image reconstructions and MIPs were obtained to evaluate the vascular anatomy.  CONTRAST:  75 cc of Isovue 370  COMPARISON:  07/21/2015  FINDINGS: Cardiovascular: Normal heart size. Aortic atherosclerosis noted. The diameter of the ascending thoracic aorta at the sinuses Valsalva measure 3.8 cm, image 72 series 4. Unchanged from previous exam. At the proximal arch the aorta measures 3.5 cm, image 40 of series 4. Previously 3.2 cm. Distal large the aorta measures 2.8 cm, image 36 of series 4. Unchanged from previous exam. Descending thoracic aorta measures 2.3 cm, image 68 of series 4.  Mediastinum/Nodes: No enlarged mediastinal, hilar, or  axillary lymph nodes. Thyroid gland, trachea, and esophagus demonstrate no significant findings. Small hiatal hernia identified.  Lungs/Pleura: Perifissural nodule within the right midlung is unchanged measuring 6 mm, image 48 of series 5. No airspace consolidation identified there is a subpleural nodule within the posterior bladder which measures 5 mm, image 22 of series 5.  Upper Abdomen: No acute abnormality.  Musculoskeletal: No chest wall abnormality. No acute or significant osseous findings.  Review of the MIP images confirms the above  findings.  IMPRESSION: 1. No acute findings. 2. No change and top-normal caliber/mild aneurysmal dilatation of the ascending thoracic aorta. Recommend annual imaging followup by CTA or MRA. This recommendation follows 2010 ACCF/AHA/AATS/ACR/ASA/SCA/SCAI/SIR/STS/SVM Guidelines for the Diagnosis and Management of Patients with Thoracic Aortic Disease. Circulation.2010; 121: [id number] 3. Small pulmonary nodules measuring up to 6 mm are unchanged from previous exam.   Electronically Signed   By: Kerby Moors M.D.   On: 07/11/2016 13:39 I personally reviewed the CT chest and concur with findings noted above  Impression: [name] is a 70 year old woman with a 4 cm ascending aortic aneurysm. This is been unchanged going back to 2009/2010. There is no indication for surgery. There is been no growth of the aneurysm over that time. Blood pressure control is the treatment. That has been well-controlled.  Multiple small lung nodules- likely and parenchymal lymph nodes. Unchanged over time.  Chest pain- symptoms are not classic for angina, but I agree that a cardiac workup would be helpful  Plan: Return in one year with CT angiogram of chest  Melrose Nakayama, MD [facility] [phone]

## 2016-07-21 DIAGNOSIS — I712 Thoracic aortic aneurysm, without rupture: Secondary | ICD-10-CM | POA: Diagnosis not present

## 2016-07-21 DIAGNOSIS — C50912 Malignant neoplasm of unspecified site of left female breast: Secondary | ICD-10-CM | POA: Diagnosis not present

## 2016-07-21 DIAGNOSIS — Z9889 Other specified postprocedural states: Secondary | ICD-10-CM | POA: Diagnosis not present

## 2016-08-03 DIAGNOSIS — R0789 Other chest pain: Secondary | ICD-10-CM | POA: Diagnosis not present

## 2016-08-03 DIAGNOSIS — E785 Hyperlipidemia, unspecified: Secondary | ICD-10-CM | POA: Diagnosis not present

## 2016-08-03 DIAGNOSIS — I10 Essential (primary) hypertension: Secondary | ICD-10-CM | POA: Diagnosis not present

## 2016-08-03 DIAGNOSIS — R9431 Abnormal electrocardiogram [ECG] [EKG]: Secondary | ICD-10-CM | POA: Diagnosis not present

## 2016-08-15 ENCOUNTER — Encounter (HOSPITAL_BASED_OUTPATIENT_CLINIC_OR_DEPARTMENT_OTHER): Payer: Self-pay | Admitting: *Deleted

## 2016-08-15 ENCOUNTER — Emergency Department (HOSPITAL_BASED_OUTPATIENT_CLINIC_OR_DEPARTMENT_OTHER)
Admission: EM | Admit: 2016-08-15 | Discharge: 2016-08-15 | Disposition: A | Discharge status: Home / Self Care | Payer: Medicare Other | Attending: Emergency Medicine | Admitting: Emergency Medicine

## 2016-08-15 ENCOUNTER — Emergency Department (HOSPITAL_BASED_OUTPATIENT_CLINIC_OR_DEPARTMENT_OTHER): Payer: Medicare Other

## 2016-08-15 DIAGNOSIS — I10 Essential (primary) hypertension: Secondary | ICD-10-CM | POA: Diagnosis not present

## 2016-08-15 DIAGNOSIS — I712 Thoracic aortic aneurysm, without rupture, unspecified: Secondary | ICD-10-CM

## 2016-08-15 DIAGNOSIS — R109 Unspecified abdominal pain: Secondary | ICD-10-CM | POA: Diagnosis present

## 2016-08-15 DIAGNOSIS — M545 Low back pain: Secondary | ICD-10-CM | POA: Diagnosis not present

## 2016-08-15 DIAGNOSIS — N200 Calculus of kidney: Secondary | ICD-10-CM | POA: Diagnosis not present

## 2016-08-15 DIAGNOSIS — M549 Dorsalgia, unspecified: Secondary | ICD-10-CM

## 2016-08-15 DIAGNOSIS — N23 Unspecified renal colic: Secondary | ICD-10-CM | POA: Diagnosis not present

## 2016-08-15 DIAGNOSIS — Z853 Personal history of malignant neoplasm of breast: Secondary | ICD-10-CM | POA: Insufficient documentation

## 2016-08-15 LAB — COMPREHENSIVE METABOLIC PANEL
ALK PHOS: 51 U/L (ref 38–126)
ALT: 17 U/L (ref 14–54)
ANION GAP: 7 (ref 5–15)
AST: 22 U/L (ref 15–41)
Albumin: 3.9 g/dL (ref 3.5–5.0)
BUN: 20 mg/dL (ref 6–20)
CALCIUM: 9.7 mg/dL (ref 8.9–10.3)
CO2: 26 mmol/L (ref 22–32)
Chloride: 107 mmol/L (ref 101–111)
Creatinine, Ser: 0.69 mg/dL (ref 0.44–1.00)
GFR calc non Af Amer: >60 mL/min (ref >60)
GLUCOSE: 102 mg/dL — AB (ref 65–99)
POTASSIUM: 3.7 mmol/L (ref 3.5–5.1)
SODIUM: 140 mmol/L (ref 135–145)
Total Bilirubin: 0.8 mg/dL (ref 0.3–1.2)
Total Protein: 7.4 g/dL (ref 6.5–8.1)

## 2016-08-15 LAB — URINALYSIS, ROUTINE W REFLEX MICROSCOPIC
BILIRUBIN URINE: NEGATIVE
GLUCOSE, UA: NEGATIVE mg/dL
KETONES UR: NEGATIVE mg/dL
NITRITE: NEGATIVE
PH: 5.5 (ref 5.0–8.0)
Protein, ur: NEGATIVE mg/dL
SPECIFIC GRAVITY, URINE: 1.016 (ref 1.005–1.030)

## 2016-08-15 LAB — CBC WITH DIFFERENTIAL/PLATELET
BASOS PCT: 0 %
Basophils Absolute: 0 10*3/uL (ref 0.0–0.1)
EOS PCT: 1 %
Eosinophils Absolute: 0 10*3/uL (ref 0.0–0.7)
HCT: 40.5 % (ref 36.0–46.0)
Hemoglobin: 13.2 g/dL (ref 12.0–15.0)
LYMPHS ABS: 2 10*3/uL (ref 0.7–4.0)
Lymphocytes Relative: 42 %
MCH: 30.8 pg (ref 26.0–34.0)
MCHC: 32.6 g/dL (ref 30.0–36.0)
MCV: 94.4 fL (ref 78.0–100.0)
MONO ABS: 0.5 10*3/uL (ref 0.1–1.0)
MONOS PCT: 10 %
NEUTROS PCT: 47 %
Neutro Abs: 2.3 10*3/uL (ref 1.7–7.7)
PLATELETS: 211 10*3/uL (ref 150–400)
RBC: 4.29 MIL/uL (ref 3.87–5.11)
RDW: 13.7 % (ref 11.5–15.5)
WBC: 4.7 10*3/uL (ref 4.0–10.5)

## 2016-08-15 LAB — URINE MICROSCOPIC-ADD ON

## 2016-08-15 MED ORDER — KETOROLAC TROMETHAMINE 30 MG/ML IJ SOLN
15.0000 mg | Freq: Once | INTRAMUSCULAR | Status: AC
  Administered 2016-08-15: 15 mg via INTRAVENOUS
  Filled 2016-08-15: qty 1

## 2016-08-15 MED ORDER — IBUPROFEN 600 MG PO TABS: 600.0000 mg | ORAL_TABLET | Freq: Four times a day (QID) | ORAL | 0 refills | Status: DC | PRN

## 2016-08-15 MED ORDER — ONDANSETRON HCL 4 MG/2ML IJ SOLN
4.0000 mg | Freq: Once | INTRAMUSCULAR | Status: AC
  Administered 2016-08-15: 4 mg via INTRAVENOUS
  Filled 2016-08-15: qty 2

## 2016-08-15 MED ORDER — IOPAMIDOL (ISOVUE-370) INJECTION 76%
100.0000 mL | Freq: Once | INTRAVENOUS | Status: AC | PRN
  Administered 2016-08-15: 100 mL via INTRAVENOUS

## 2016-08-15 MED ORDER — SODIUM CHLORIDE 0.9 % IV BOLUS (SEPSIS)
1000.0000 mL | Freq: Once | INTRAVENOUS | Status: AC
Start: 2016-08-15 — End: 2016-08-15
  Administered 2016-08-15: 1000 mL via INTRAVENOUS

## 2016-08-15 MED ORDER — MORPHINE SULFATE (PF) 4 MG/ML IV SOLN
4.0000 mg | Freq: Once | INTRAVENOUS | Status: AC
  Administered 2016-08-15: 4 mg via INTRAVENOUS
  Filled 2016-08-15: qty 1

## 2016-08-15 MED ORDER — OXYCODONE-ACETAMINOPHEN 5-325 MG PO TABS: 1.0000 | ORAL_TABLET | Freq: Four times a day (QID) | ORAL | 0 refills | Status: DC | PRN

## 2016-08-15 MED ORDER — OXYCODONE-ACETAMINOPHEN 5-325 MG PO TABS
2.0000 | ORAL_TABLET | Freq: Once | ORAL | Status: AC
  Administered 2016-08-15: 2 via ORAL
  Filled 2016-08-15: qty 2

## 2016-08-15 MED ORDER — HYDROMORPHONE HCL 1 MG/ML IJ SOLN
1.0000 mg | Freq: Once | INTRAMUSCULAR | Status: AC
  Administered 2016-08-15: 1 mg via INTRAVENOUS
  Filled 2016-08-15: qty 1

## 2016-08-15 MED FILL — OXYCODONE/APAP 5/325MG: 5-325 | 2 days supply | Qty: 10 | Fill #0

## 2016-08-15 MED FILL — IBUPROFEN 600 MG TABLET: 600 | 7 days supply | Qty: 30 | Fill #0

## 2016-08-15 NOTE — Discharge Instructions (Signed)
Take motrin for pain.   Take percocet for severe pain. DO NOT drive with it.   Call [facility] for follow up. You have multiple kidney stones and may need intervention if you still have pain.   Return to ER if you have vomiting, fever, severe chest or abdominal pain.

## 2016-08-15 NOTE — ED Triage Notes (Signed)
Pt has a hx of kidney stones. Reports intermittent back pain and urinary frequency since Sat night

## 2016-08-15 NOTE — ED Notes (Signed)
Patient transported to CT 

## 2016-08-15 NOTE — ED Provider Notes (Signed)
[facility] Provider Note   CSN: [id number] Arrival date & time: 08/15/16  [id number]     History   Chief Complaint Chief Complaint  Patient presents with  . Back Pain    HPI [name] is a 70 y.o. female hx of GERD, HTN, kidney stones, thoracic aneurysm (3.5 cm in October) here with Lower back pain, urinary frequency. Patient states that she has been having low back and flank pain for the last 3 days. States that it is worse on the left side. Denies any nausea or vomiting or fevers. Patient states that this is similar to her previous kidney stone. She had kidney stone previously and had lithotripsy several years ago with similar symptoms. Denies any blood in her urine or any dysuria, just frequency in urination. Of note, patient does have history of thoracic aneurysm that's about 3.5 cm. She had CT recently that showed that it was stable. She adamantly denies any chest pain or shortness of breath or upper back pain. Denies any numbness or weakness in lower extremities.   The history is provided by the patient.    Past Medical History:  Diagnosis Date  . Breast cancer [facility])    s/p bilateral mastectomies  . GERD (gastroesophageal reflux disease)    history of  . Hiatal hernia   . HTN (hypertension)   . Kidney stones   . Osteoporosis   . Thoracic aortic aneurysm [facility])    Dr. Roxan Hockey    Patient Active Problem List   Diagnosis Date Noted  . Ascending aortic aneurysm ([place]) 12/04/2012  . Lung nodule 12/04/2012  . Breast cancer ([place]) 02/09/2012    Past Surgical History:  Procedure Laterality Date  . ABDOMINAL HYSTERECTOMY  1984  . BREAST RECONSTRUCTION     bilateral & implants  . [place]  . CHOLECYSTECTOMY    . CYSTOSCOPY W/ URETERAL STENT PLACEMENT  07/26/2012   Procedure: CYSTOSCOPY WITH RETROGRADE PYELOGRAM/URETERAL STENT PLACEMENT;  Surgeon: Claybon Jabs, MD;  Location: [facility];  Service: Urology;  Laterality: Left;  Marland Kitchen GASTRIC  FUNDOPLICATION    . HEMORROIDECTOMY    . MASTECTOMY  03/2008   bilateral  . ROTATOR CUFF REPAIR  10 years ago    Left   . STONE EXTRACTION WITH BASKET  07/26/2012   Procedure: STONE EXTRACTION WITH BASKET;  Surgeon: Claybon Jabs, MD;  Location: [facility];  Service: Urology;  Laterality: Left;  . URETEROSCOPY  07/26/2012   Procedure: URETEROSCOPY;  Surgeon: Claybon Jabs, MD;  Location: [facility];  Service: Urology;  Laterality: Left;    OB History    No data available       Home Medications    Prior to Admission medications   Medication Sig Start Date End Date Taking? Authorizing Provider  acetaminophen (TYLENOL) 500 MG tablet Take 1,000 mg by mouth every 6 (six) hours as needed for moderate pain (back pain).     Historical Provider, MD  amLODipine-benazepril (LOTREL) 5-10 MG per capsule Take 1 capsule by mouth daily. 02/09/12   Chauncey Cruel, MD  Cholecalciferol (VITAMIN D3) 2000 UNITS capsule Take by mouth.    Historical Provider, MD    Family History Family History  Problem Relation Age of Onset  . Diabetes Mother   . Heart disease Father   . Hyperlipidemia Sister   . Diabetes Sister   . Diabetes Sister   . Hyperlipidemia Sister     Social History Social History  Substance Use  Topics  . Smoking status: Never Smoker  . Smokeless tobacco: Never Used  . Alcohol use No     Allergies   Ceclor [cefaclor]; Sulfa antibiotics; and Codeine   Review of Systems Review of Systems  Musculoskeletal: Positive for back pain.  All other systems reviewed and are negative.    Physical Exam Updated Vital Signs BP 117/77 (BP Location: Right Arm)   Pulse (!) 59   Temp 97.6 F (36.4 C) (Oral)   Resp 18   Ht 5\' 2"  (1.575 m)   Wt 135 lb (61.2 kg)   SpO2 95%   BMI 24.69 kg/m   Physical Exam  Constitutional: She is oriented to person, place, and time.  Slightly uncomfortable   HENT:  Head: Normocephalic.  Mouth/Throat: Oropharynx is clear and moist.  Eyes: EOM are  normal. Pupils are equal, round, and reactive to light.  Neck: Normal range of motion. Neck supple.  Cardiovascular: Normal rate, regular rhythm, normal heart sounds and intact distal pulses.   Pulmonary/Chest: Effort normal and breath sounds normal. No respiratory distress. She has no wheezes. She has no rales.  Abdominal: Soft. Bowel sounds are normal. She exhibits no distension. There is no tenderness. There is no guarding.  Musculoskeletal:  Mild L paralumbar vs CVAT vs SI joint tenderness. Pelvis stable. Good peripheral pulses. Neurovascular intact in lower extremities   Neurological: She is alert and oriented to person, place, and time. She displays normal reflexes. No cranial nerve deficit. Coordination normal.  Skin: Skin is warm.  Psychiatric: She has a normal mood and affect.  Nursing note and vitals reviewed.    ED Treatments / Results  Labs (all labs ordered are listed, but only abnormal results are displayed) Labs Reviewed  URINALYSIS, ROUTINE W REFLEX MICROSCOPIC (NOT AT [facility]) - Abnormal; Notable for the following:       Result Value   Hgb urine dipstick MODERATE (*)    Leukocytes, UA SMALL (*)    All other components within normal limits  COMPREHENSIVE METABOLIC PANEL - Abnormal; Notable for the following:    Glucose, Bld 102 (*)    All other components within normal limits  URINE MICROSCOPIC-ADD ON - Abnormal; Notable for the following:    Squamous Epithelial / LPF 0-5 (*)    Bacteria, UA FEW (*)    All other components within normal limits  CBC WITH DIFFERENTIAL/PLATELET    EKG  EKG Interpretation None       Radiology Ct Angio Chest Aorta W/cm &/or Wo/cm  Result Date: 08/15/2016 CLINICAL DATA:  bilateral flank pains, states that she has a hx of thoracic aneurysm, per Patient she states that when she has kidney stones usually pain medication helps to relieve the pains but today the medications have not helped, hx of breast cancer, HTN, hiatal hernia, GERD,  osteoporosis, r/o dissection EXAM: CT ANGIOGRAPHY CHEST, ABDOMEN AND PELVIS TECHNIQUE: Multidetector CT imaging through the chest, abdomen and pelvis was performed using the standard protocol before and during bolus administration of intravenous contrast. Multiplanar reconstructed images and MIPs were obtained and reviewed to evaluate the vascular anatomy. CONTRAST:  100cc Isovue 370: 4cc/sec: COMPARISON:  07/11/2016 and previous FINDINGS: CTA CHEST FINDINGS Cardiovascular: Right arm IV contrast administration. SVC patent. Right ventricle nondilated. Satisfactory opacification of pulmonary arteries noted, and there is no evidence of pulmonary emboli. Patent bilateral pulmonary veins drain into the left atrium. Adequate contrast opacification of the thoracic aorta with no evidence of dissection or stenosis. Thoracic aorta diameter measurements as  follows: 4.6 cm sinuses of Valsalva 3.7 cm sino-tubular junction 4.1 cm mid ascending (stable by my measurement since prior study) 3.5 cm distal ascending/proximal arch 2.6 cm distal arch 3.1 cm proximal descending 2.4 cm distal descending just above the diaphragm There is bovine variant brachiocephalic arch anatomy without proximal stenosis. Minimal scattered calcified plaque in the distal arch. Mediastinum/Nodes: No mediastinal hematoma. No pericardial effusion. No adenopathy localized. Lungs/Pleura: No pneumothorax. No pleural effusion. Minimal dependent subsegmental atelectasis or scarring posteriorly in both lungs. Musculoskeletal: Bilateral breast implants. Regional bones unremarkable. Review of the MIP images confirms the above findings. CTA ABDOMEN AND PELVIS FINDINGS VASCULAR Aorta: Normal caliber aorta without aneurysm, dissection, vasculitis or significant stenosis. Celiac: Patent without evidence of aneurysm, dissection, vasculitis or significant stenosis. SMA: Widely patent. Replaced right hepatic arterial supply, an anatomic variant. Renals: Duplicated on the  left, superior dominant, without high-grade stenosis. There is mild eccentric ostial plaque. Single on the right, widely patent. IMA: Patent without evidence of aneurysm, dissection, vasculitis or significant stenosis. Inflow: Patent without evidence of aneurysm, dissection, vasculitis or significant stenosis. Veins: No obvious venous abnormality within the limitations of this arterial phase study. Dedicated venous phase imaging not obtained. Patent bilateral renal veins noted. Bilateral pelvic phleboliths. Review of the MIP images confirms the above findings. NON-VASCULAR Hepatobiliary: 3 cm benign hemangioma in hepatic segment 5, stable since films dating back to 06/29/2012. No new focal liver abnormality is seen. Status post cholecystectomy. No biliary dilatation. Pancreas: Unremarkable. No pancreatic ductal dilatation or surrounding inflammatory changes. Spleen: Normal in size without focal abnormality. Adrenals/Urinary Tract: Bilateral nephrolithiasis heart, largest on the left in the lower pole 4 mm, on the right in the lower pole 3 mm. Stable 4.3 cm cyst, lower pole left kidney. No hydronephrosis. Normal adrenal glands. Urinary bladder physiologically distended. Stomach/Bowel: Small hiatal hernia. Surgical clips around the GE junction. Small bowel nondilated. Colon nondilated. Scattered colonic diverticula without adjacent inflammatory/edematous change. Lymphatic: No adenopathy localized. Reproductive: Status post hysterectomy. No adnexal masses. Other: No ascites.  No free air. Musculoskeletal: No acute or significant osseous findings. Review of the MIP images confirms the above findings. IMPRESSION: 1. Stable 4.1 cm ascending aortic aneurysm without complicating features. Recommend annual imaging followup by CTA or MRA. This recommendation follows 2010 ACCF/AHA/AATS/ACR/ASA/SCA/SCAI/SIR/STS/SVM Guidelines for the Diagnosis and Management of Patients with Thoracic Aortic Disease. Circulation. 2010; 121:  [id number] 2. Negative for acute PE or thoracic aortic dissection. 3. Bilateral nephrolithiasis without hydronephrosis. 4. Small hiatal hernia. 5. Stable benign hepatic segment 5 hemangioma. 6. Stable left renal cyst. Electronically Signed   By: Lucrezia Europe M.D.   On: 08/15/2016 12:31   Ct Renal Stone Study  Result Date: 08/15/2016 CLINICAL DATA:  Left flank and back pain.  Evaluate for stone. EXAM: CT ABDOMEN AND PELVIS WITHOUT CONTRAST TECHNIQUE: Multidetector CT imaging of the abdomen and pelvis was performed following the standard protocol without IV contrast. COMPARISON:  08/11/2014 FINDINGS: Lower chest: Partly visualized breast implants. Changes of Nissen fundoplication which may be above the diaphragm, stable from prior. Hepatobiliary: 32 mm mass in segment 6 which is a hemangioma based on previous CT.Cholecystectomy. Chronic enlargement of the common bile duct, likely reservoir effect. Pancreas: Unremarkable. Spleen: Unremarkable. Adrenals/Urinary Tract: Negative adrenals. Bilateral renal calculi, at least 3 on the right and 6 on the left (most left-sided calculi are visible on reformats and punctate). The largest are in the lower poles and measure up to 6 mm. No hydronephrosis. 4 cm left renal cyst that had simple  appearance on enhanced scan in 2015. Unremarkable bladder. Stomach/Bowel: Formed stool in most colonic segments without rectal impaction or over distention. Colonic diverticula without active inflammation. No pericecal inflammation. Vascular/Lymphatic: No acute vascular abnormality. No mass or adenopathy. Reproductive:Hysterectomy.  Probable oophorectomies. Other: No ascites or pneumoperitoneum. Musculoskeletal: No acute abnormalities. IMPRESSION: 1. No hydronephrosis or ureteral calculus. 2. Multiple bilateral renal calculi. 3. Colonic diverticulosis. 4. 3 cm right liver hemangioma. Electronically Signed   By: Monte Fantasia M.D.   On: 08/15/2016 10:07   Ct Angio Abd/pel W And/or Wo  Contrast  Result Date: 08/15/2016 CLINICAL DATA:  bilateral flank pains, states that she has a hx of thoracic aneurysm, per Patient she states that when she has kidney stones usually pain medication helps to relieve the pains but today the medications have not helped, hx of breast cancer, HTN, hiatal hernia, GERD, osteoporosis, r/o dissection EXAM: CT ANGIOGRAPHY CHEST, ABDOMEN AND PELVIS TECHNIQUE: Multidetector CT imaging through the chest, abdomen and pelvis was performed using the standard protocol before and during bolus administration of intravenous contrast. Multiplanar reconstructed images and MIPs were obtained and reviewed to evaluate the vascular anatomy. CONTRAST:  100cc Isovue 370: 4cc/sec: COMPARISON:  07/11/2016 and previous FINDINGS: CTA CHEST FINDINGS Cardiovascular: Right arm IV contrast administration. SVC patent. Right ventricle nondilated. Satisfactory opacification of pulmonary arteries noted, and there is no evidence of pulmonary emboli. Patent bilateral pulmonary veins drain into the left atrium. Adequate contrast opacification of the thoracic aorta with no evidence of dissection or stenosis. Thoracic aorta diameter measurements as follows: 4.6 cm sinuses of Valsalva 3.7 cm sino-tubular junction 4.1 cm mid ascending (stable by my measurement since prior study) 3.5 cm distal ascending/proximal arch 2.6 cm distal arch 3.1 cm proximal descending 2.4 cm distal descending just above the diaphragm There is bovine variant brachiocephalic arch anatomy without proximal stenosis. Minimal scattered calcified plaque in the distal arch. Mediastinum/Nodes: No mediastinal hematoma. No pericardial effusion. No adenopathy localized. Lungs/Pleura: No pneumothorax. No pleural effusion. Minimal dependent subsegmental atelectasis or scarring posteriorly in both lungs. Musculoskeletal: Bilateral breast implants. Regional bones unremarkable. Review of the MIP images confirms the above findings. CTA ABDOMEN AND  PELVIS FINDINGS VASCULAR Aorta: Normal caliber aorta without aneurysm, dissection, vasculitis or significant stenosis. Celiac: Patent without evidence of aneurysm, dissection, vasculitis or significant stenosis. SMA: Widely patent. Replaced right hepatic arterial supply, an anatomic variant. Renals: Duplicated on the left, superior dominant, without high-grade stenosis. There is mild eccentric ostial plaque. Single on the right, widely patent. IMA: Patent without evidence of aneurysm, dissection, vasculitis or significant stenosis. Inflow: Patent without evidence of aneurysm, dissection, vasculitis or significant stenosis. Veins: No obvious venous abnormality within the limitations of this arterial phase study. Dedicated venous phase imaging not obtained. Patent bilateral renal veins noted. Bilateral pelvic phleboliths. Review of the MIP images confirms the above findings. NON-VASCULAR Hepatobiliary: 3 cm benign hemangioma in hepatic segment 5, stable since films dating back to 06/29/2012. No new focal liver abnormality is seen. Status post cholecystectomy. No biliary dilatation. Pancreas: Unremarkable. No pancreatic ductal dilatation or surrounding inflammatory changes. Spleen: Normal in size without focal abnormality. Adrenals/Urinary Tract: Bilateral nephrolithiasis heart, largest on the left in the lower pole 4 mm, on the right in the lower pole 3 mm. Stable 4.3 cm cyst, lower pole left kidney. No hydronephrosis. Normal adrenal glands. Urinary bladder physiologically distended. Stomach/Bowel: Small hiatal hernia. Surgical clips around the GE junction. Small bowel nondilated. Colon nondilated. Scattered colonic diverticula without adjacent inflammatory/edematous change. Lymphatic: No adenopathy localized.  Reproductive: Status post hysterectomy. No adnexal masses. Other: No ascites.  No free air. Musculoskeletal: No acute or significant osseous findings. Review of the MIP images confirms the above findings.  IMPRESSION: 1. Stable 4.1 cm ascending aortic aneurysm without complicating features. Recommend annual imaging followup by CTA or MRA. This recommendation follows 2010 ACCF/AHA/AATS/ACR/ASA/SCA/SCAI/SIR/STS/SVM Guidelines for the Diagnosis and Management of Patients with Thoracic Aortic Disease. Circulation. 2010; 121: [id number] 2. Negative for acute PE or thoracic aortic dissection. 3. Bilateral nephrolithiasis without hydronephrosis. 4. Small hiatal hernia. 5. Stable benign hepatic segment 5 hemangioma. 6. Stable left renal cyst. Electronically Signed   By: Lucrezia Europe M.D.   On: 08/15/2016 12:31    Procedures Procedures (including critical care time)  Medications Ordered in ED Medications  oxyCODONE-acetaminophen (PERCOCET/ROXICET) 5-325 MG per tablet 2 tablet (not administered)  ketorolac (TORADOL) 30 MG/ML injection 15 mg (not administered)  sodium chloride 0.9 % bolus 1,000 mL (0 mLs Intravenous Stopped 08/15/16 1058)  morphine 4 MG/ML injection 4 mg (4 mg Intravenous Given 08/15/16 0925)  ondansetron (ZOFRAN) injection 4 mg (4 mg Intravenous Given 08/15/16 0925)  HYDROmorphone (DILAUDID) injection 1 mg (1 mg Intravenous Given 08/15/16 1011)  iopamidol (ISOVUE-370) 76 % injection 100 mL (100 mLs Intravenous Contrast Given 08/15/16 1118)     Initial Impression / Assessment and Plan / ED Course  I have reviewed the triage vital signs and the nursing notes.  Pertinent labs & imaging results that were available during my care of the patient were reviewed by me and considered in my medical decision making (see chart for details).  Clinical Course     MAZZY WHITEFOOT is a 70 y.o. female here with L back vs flank pain. Hx of kidney stone. Likely renal colic vs MSk. Hx of thoracic aneurysm that is only 3.5 cm and was stable on recent CT an she is not hypertensive or have chest pain or upper back. I doubt dissection. Will get labs, UA, CT renal stone. Will give pain meds and reassess.   11  am Still had pain. CT renal stone showed bilateral stones but no hydro. Labs unremarkable. UA showed some blood but no UTI. Will get CT angio now to r/o any dissection or ruptured aneurysm.   1:01 PM CT showed no dissection and aneurysm is stable. Pain improved. Likely renal colic. Recommend percocet, motrin, urology follow up    Final Clinical Impressions(s) / ED Diagnoses   Final diagnoses:  Back pain    New Prescriptions New Prescriptions   No medications on file     Drenda Freeze, MD 08/15/16 1301

## 2016-08-16 DIAGNOSIS — N200 Calculus of kidney: Secondary | ICD-10-CM | POA: Diagnosis not present

## 2016-08-16 DIAGNOSIS — N281 Cyst of kidney, acquired: Secondary | ICD-10-CM | POA: Diagnosis not present

## 2016-08-24 DIAGNOSIS — D1801 Hemangioma of skin and subcutaneous tissue: Secondary | ICD-10-CM | POA: Diagnosis not present

## 2016-08-24 DIAGNOSIS — D210 Benign neoplasm of connective and other soft tissue of head, face and neck: Secondary | ICD-10-CM | POA: Diagnosis not present

## 2016-08-24 DIAGNOSIS — N200 Calculus of kidney: Secondary | ICD-10-CM | POA: Diagnosis not present

## 2016-08-29 DIAGNOSIS — K219 Gastro-esophageal reflux disease without esophagitis: Secondary | ICD-10-CM | POA: Diagnosis not present

## 2016-08-29 DIAGNOSIS — K449 Diaphragmatic hernia without obstruction or gangrene: Secondary | ICD-10-CM | POA: Diagnosis not present

## 2016-08-29 DIAGNOSIS — R079 Chest pain, unspecified: Secondary | ICD-10-CM | POA: Diagnosis not present

## 2016-09-01 DIAGNOSIS — I712 Thoracic aortic aneurysm, without rupture: Secondary | ICD-10-CM | POA: Diagnosis not present

## 2016-09-01 DIAGNOSIS — I700 Atherosclerosis of aorta: Secondary | ICD-10-CM | POA: Diagnosis not present

## 2016-09-01 DIAGNOSIS — R9431 Abnormal electrocardiogram [ECG] [EKG]: Secondary | ICD-10-CM | POA: Diagnosis not present

## 2016-09-01 DIAGNOSIS — I10 Essential (primary) hypertension: Secondary | ICD-10-CM | POA: Diagnosis not present

## 2016-09-08 DIAGNOSIS — R12 Heartburn: Secondary | ICD-10-CM | POA: Diagnosis not present

## 2016-09-08 DIAGNOSIS — R079 Chest pain, unspecified: Secondary | ICD-10-CM | POA: Diagnosis not present

## 2016-09-08 DIAGNOSIS — K219 Gastro-esophageal reflux disease without esophagitis: Secondary | ICD-10-CM | POA: Diagnosis not present

## 2016-10-14 DIAGNOSIS — E785 Hyperlipidemia, unspecified: Secondary | ICD-10-CM | POA: Diagnosis not present

## 2016-10-19 DIAGNOSIS — R0789 Other chest pain: Secondary | ICD-10-CM | POA: Diagnosis not present

## 2016-10-19 DIAGNOSIS — E785 Hyperlipidemia, unspecified: Secondary | ICD-10-CM | POA: Diagnosis not present

## 2016-10-19 DIAGNOSIS — I712 Thoracic aortic aneurysm, without rupture: Secondary | ICD-10-CM | POA: Diagnosis not present

## 2016-10-19 DIAGNOSIS — I700 Atherosclerosis of aorta: Secondary | ICD-10-CM | POA: Diagnosis not present

## 2016-10-26 DIAGNOSIS — I712 Thoracic aortic aneurysm, without rupture: Secondary | ICD-10-CM | POA: Diagnosis not present

## 2016-11-07 DIAGNOSIS — H04123 Dry eye syndrome of bilateral lacrimal glands: Secondary | ICD-10-CM | POA: Diagnosis not present

## 2016-11-07 DIAGNOSIS — H40013 Open angle with borderline findings, low risk, bilateral: Secondary | ICD-10-CM | POA: Diagnosis not present

## 2016-11-07 DIAGNOSIS — Z961 Presence of intraocular lens: Secondary | ICD-10-CM | POA: Diagnosis not present

## 2016-11-07 DIAGNOSIS — H43813 Vitreous degeneration, bilateral: Secondary | ICD-10-CM | POA: Diagnosis not present

## 2016-12-08 DIAGNOSIS — M859 Disorder of bone density and structure, unspecified: Secondary | ICD-10-CM | POA: Diagnosis not present

## 2016-12-08 DIAGNOSIS — I10 Essential (primary) hypertension: Secondary | ICD-10-CM | POA: Diagnosis not present

## 2017-01-02 ENCOUNTER — Ambulatory Visit (HOSPITAL_COMMUNITY): Admission: RE | Admit: 2017-01-02 | Payer: Medicare Other | Source: Ambulatory Visit

## 2017-01-05 ENCOUNTER — Ambulatory Visit (HOSPITAL_COMMUNITY)
Admission: RE | Admit: 2017-01-05 | Discharge: 2017-01-05 | Disposition: A | Discharge status: Home / Self Care | Payer: Medicare Other | Source: Ambulatory Visit | Attending: Internal Medicine | Admitting: Internal Medicine

## 2017-01-05 ENCOUNTER — Encounter (HOSPITAL_COMMUNITY): Payer: Self-pay

## 2017-01-05 DIAGNOSIS — M810 Age-related osteoporosis without current pathological fracture: Secondary | ICD-10-CM | POA: Insufficient documentation

## 2017-01-05 MED ORDER — SODIUM CHLORIDE 0.9 % IV SOLN
Freq: Once | INTRAVENOUS | Status: AC
  Administered 2017-01-05: 14:00:00 via INTRAVENOUS

## 2017-01-05 MED ORDER — ZOLEDRONIC ACID 5 MG/100ML IV SOLN
5.0000 mg | Freq: Once | INTRAVENOUS | Status: AC
  Administered 2017-01-05: 5 mg via INTRAVENOUS
  Filled 2017-01-05: qty 100

## 2017-01-05 NOTE — Discharge Instructions (Signed)
Drink  Fluids/water as tolerated over the next 72 hours °Tylenol or ibuprofen if needed for aches and pains  °Continue Calcium and Vit D as directed by your MD ° ° ° °Reclast °Zoledronic Acid injection (Paget's Disease, Osteoporosis) °What is this medicine? °ZOLEDRONIC ACID (ZOE le dron ik AS id) lowers the amount of calcium loss from bone. It is used to treat Paget's disease and osteoporosis in women. °This medicine may be used for other purposes; ask your health care provider or pharmacist if you have questions. °COMMON BRAND NAME(S): Reclast, Zometa °What should I tell my health care provider before I take this medicine? °They need to know if you have any of these conditions: °-aspirin-sensitive asthma °-cancer, especially if you are receiving medicines used to treat cancer °-dental disease or wear dentures °-infection °-kidney disease °-low levels of calcium in the blood °-past surgery on the parathyroid gland or intestines °-receiving corticosteroids like dexamethasone or prednisone °-an unusual or allergic reaction to zoledronic acid, other medicines, foods, dyes, or preservatives °-pregnant or trying to get pregnant °-breast-feeding °How should I use this medicine? °This medicine is for infusion into a vein. It is given by a health care professional in a hospital or clinic setting. °Talk to your pediatrician regarding the use of this medicine in children. This medicine is not approved for use in children. °Overdosage: If you think you have taken too much of this medicine contact a poison control center or emergency room at once. °NOTE: This medicine is only for you. Do not share this medicine with others. °What if I miss a dose? °It is important not to miss your dose. Call your doctor or health care professional if you are unable to keep an appointment. °What may interact with this medicine? °-certain antibiotics given by injection °-NSAIDs, medicines for pain and inflammation, like ibuprofen or  naproxen °-some diuretics like bumetanide, furosemide °-teriparatide °This list may not describe all possible interactions. Give your health care provider a list of all the medicines, herbs, non-prescription drugs, or dietary supplements you use. Also tell them if you smoke, drink alcohol, or use illegal drugs. Some items may interact with your medicine. °What should I watch for while using this medicine? °Visit your doctor or health care professional for regular checkups. It may be some time before you see the benefit from this medicine. Do not stop taking your medicine unless your doctor tells you to. Your doctor may order blood tests or other tests to see how you are doing. °Women should inform their doctor if they wish to become pregnant or think they might be pregnant. There is a potential for serious side effects to an unborn child. Talk to your health care professional or pharmacist for more information. °You should make sure that you get enough calcium and vitamin D while you are taking this medicine. Discuss the foods you eat and the vitamins you take with your health care professional. °Some people who take this medicine have severe bone, joint, and/or muscle pain. This medicine may also increase your risk for jaw problems or a broken thigh bone. Tell your doctor right away if you have severe pain in your jaw, bones, joints, or muscles. Tell your doctor if you have any pain that does not go away or that gets worse. °Tell your dentist and dental surgeon that you are taking this medicine. You should not have major dental surgery while on this medicine. See your dentist to have a dental exam and fix any dental problems   before starting this medicine. Take good care of your teeth while on this medicine. Make sure you see your dentist for regular follow-up appointments. °What side effects may I notice from receiving this medicine? °Side effects that you should report to your doctor or health care professional as  soon as possible: °-allergic reactions like skin rash, itching or hives, swelling of the face, lips, or tongue °-anxiety, confusion, or depression °-breathing problems °-changes in vision °-eye pain °-feeling faint or lightheaded, falls °-jaw pain, especially after dental work °-mouth sores °-muscle cramps, stiffness, or weakness °-redness, blistering, peeling or loosening of the skin, including inside the mouth °-trouble passing urine or change in the amount of urine °Side effects that usually do not require medical attention (report to your doctor or health care professional if they continue or are bothersome): °-bone, joint, or muscle pain °-constipation °-diarrhea °-fever °-hair loss °-irritation at site where injected °-loss of appetite °-nausea, vomiting °-stomach upset °-trouble sleeping °-trouble swallowing °-weak or tired °This list may not describe all possible side effects. Call your doctor for medical advice about side effects. You may report side effects to FDA at 1-800-FDA-1088. °Where should I keep my medicine? °This drug is given in a hospital or clinic and will not be stored at home. °NOTE: This sheet is a summary. It may not cover all possible information. If you have questions about this medicine, talk to your doctor, pharmacist, or health care provider. °© 2018 [employer] (2014-02-08 14:19:57) ° °

## 2017-06-02 DIAGNOSIS — R3121 Asymptomatic microscopic hematuria: Secondary | ICD-10-CM | POA: Diagnosis not present

## 2017-06-02 DIAGNOSIS — N200 Calculus of kidney: Secondary | ICD-10-CM | POA: Diagnosis not present

## 2017-06-20 ENCOUNTER — Other Ambulatory Visit: Payer: Self-pay | Admitting: *Deleted

## 2017-06-20 DIAGNOSIS — I712 Thoracic aortic aneurysm, without rupture, unspecified: Secondary | ICD-10-CM

## 2017-06-20 DIAGNOSIS — I7121 Aneurysm of the ascending aorta, without rupture: Secondary | ICD-10-CM

## 2017-06-20 DIAGNOSIS — R911 Solitary pulmonary nodule: Secondary | ICD-10-CM

## 2017-06-25 DIAGNOSIS — R21 Rash and other nonspecific skin eruption: Secondary | ICD-10-CM | POA: Diagnosis not present

## 2017-06-25 DIAGNOSIS — L271 Localized skin eruption due to drugs and medicaments taken internally: Secondary | ICD-10-CM | POA: Diagnosis not present

## 2017-06-27 DIAGNOSIS — R3121 Asymptomatic microscopic hematuria: Secondary | ICD-10-CM | POA: Diagnosis not present

## 2017-06-28 DIAGNOSIS — R3121 Asymptomatic microscopic hematuria: Secondary | ICD-10-CM | POA: Diagnosis not present

## 2017-07-04 DIAGNOSIS — R3129 Other microscopic hematuria: Secondary | ICD-10-CM | POA: Diagnosis not present

## 2017-07-04 DIAGNOSIS — N200 Calculus of kidney: Secondary | ICD-10-CM | POA: Diagnosis not present

## 2017-07-10 DIAGNOSIS — Z0000 Encounter for general adult medical examination without abnormal findings: Secondary | ICD-10-CM | POA: Diagnosis not present

## 2017-07-10 DIAGNOSIS — M859 Disorder of bone density and structure, unspecified: Secondary | ICD-10-CM | POA: Diagnosis not present

## 2017-07-10 DIAGNOSIS — E7849 Other hyperlipidemia: Secondary | ICD-10-CM | POA: Diagnosis not present

## 2017-07-10 DIAGNOSIS — I10 Essential (primary) hypertension: Secondary | ICD-10-CM | POA: Diagnosis not present

## 2017-07-12 DIAGNOSIS — R102 Pelvic and perineal pain: Secondary | ICD-10-CM | POA: Diagnosis not present

## 2017-07-12 DIAGNOSIS — R350 Frequency of micturition: Secondary | ICD-10-CM | POA: Diagnosis not present

## 2017-07-12 DIAGNOSIS — N200 Calculus of kidney: Secondary | ICD-10-CM | POA: Diagnosis not present

## 2017-07-14 DIAGNOSIS — M1711 Unilateral primary osteoarthritis, right knee: Secondary | ICD-10-CM | POA: Diagnosis not present

## 2017-07-17 DIAGNOSIS — M859 Disorder of bone density and structure, unspecified: Secondary | ICD-10-CM | POA: Diagnosis not present

## 2017-07-17 DIAGNOSIS — Z6824 Body mass index (BMI) 24.0-24.9, adult: Secondary | ICD-10-CM | POA: Diagnosis not present

## 2017-07-17 DIAGNOSIS — E7849 Other hyperlipidemia: Secondary | ICD-10-CM | POA: Diagnosis not present

## 2017-07-17 DIAGNOSIS — I10 Essential (primary) hypertension: Secondary | ICD-10-CM | POA: Diagnosis not present

## 2017-07-17 DIAGNOSIS — Z1212 Encounter for screening for malignant neoplasm of rectum: Secondary | ICD-10-CM | POA: Diagnosis not present

## 2017-07-17 DIAGNOSIS — F418 Other specified anxiety disorders: Secondary | ICD-10-CM | POA: Diagnosis not present

## 2017-07-17 DIAGNOSIS — Z0000 Encounter for general adult medical examination without abnormal findings: Secondary | ICD-10-CM | POA: Diagnosis not present

## 2017-07-17 DIAGNOSIS — Z1389 Encounter for screening for other disorder: Secondary | ICD-10-CM | POA: Diagnosis not present

## 2017-07-17 DIAGNOSIS — N200 Calculus of kidney: Secondary | ICD-10-CM | POA: Diagnosis not present

## 2017-07-18 ENCOUNTER — Encounter: Payer: Self-pay | Admitting: Thoracic Surgery (Cardiothoracic Vascular Surgery)

## 2017-07-18 ENCOUNTER — Ambulatory Visit (INDEPENDENT_AMBULATORY_CARE_PROVIDER_SITE_OTHER): Payer: Medicare Other | Admitting: Thoracic Surgery (Cardiothoracic Vascular Surgery)

## 2017-07-18 ENCOUNTER — Ambulatory Visit
Admission: RE | Admit: 2017-07-18 | Discharge: 2017-07-18 | Disposition: A | Discharge status: Home / Self Care | Payer: Medicare Other | Source: Ambulatory Visit | Attending: Thoracic Surgery (Cardiothoracic Vascular Surgery) | Admitting: Thoracic Surgery (Cardiothoracic Vascular Surgery)

## 2017-07-18 VITALS — BP 115/76 | HR 86 | Resp 16 | Ht 62.5 in | Wt 135.0 lb

## 2017-07-18 DIAGNOSIS — R911 Solitary pulmonary nodule: Secondary | ICD-10-CM

## 2017-07-18 DIAGNOSIS — I712 Thoracic aortic aneurysm, without rupture, unspecified: Secondary | ICD-10-CM

## 2017-07-18 DIAGNOSIS — I700 Atherosclerosis of aorta: Secondary | ICD-10-CM | POA: Diagnosis not present

## 2017-07-18 DIAGNOSIS — I7121 Aneurysm of the ascending aorta, without rupture: Secondary | ICD-10-CM

## 2017-07-18 MED ORDER — IOPAMIDOL (ISOVUE-370) INJECTION 76%
75.0000 mL | Freq: Once | INTRAVENOUS | Status: AC | PRN
  Administered 2017-07-18: 75 mL via INTRAVENOUS

## 2017-07-18 NOTE — Progress Notes (Signed)
[address]       [name],Jess[place] [address]             [phone]    HPI: 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 Have an ascending aneurysm back in 2009. The radiologist measured it at 4.6 x 4 cm. In 2010 it measured 4 cm. It has remained relatively stable since that time. She also has multiple small lung nodules that have remained stable as well. Those are probably parenchymal lymph nodes.  In the interim since her last visit she and her husband have moved to [facility].  She no longer is anxious about the aneurysm. She has not been having any chest pain or shortness of breath. She does walk on a regular basis. Overall she feels well.  Past Medical History:  Diagnosis Date  . Breast cancer [facility])    s/p bilateral mastectomies  . GERD (gastroesophageal reflux disease)    history of  . Hiatal hernia   . HTN (hypertension)   . Kidney stones   . Osteoporosis   . Thoracic aortic aneurysm (HCC)    Dr. Roxan Hockey     Current Outpatient Prescriptions  Medication Sig Dispense Refill  . acetaminophen (TYLENOL) 500 MG tablet Take 1,000 mg by mouth every 6 (six) hours as needed for moderate pain (back pain).     Marland Kitchen amLODipine-benazepril (LOTREL) 5-10 MG per capsule Take 1 capsule by mouth daily.     No current facility-administered medications for this visit.     Physical Exam BP 115/76 (BP Location: Right Arm, Patient Position: Sitting, Cuff Size: Large)   Pulse 86   Resp 16   Ht 5' 2.5" (1.588 m)   Wt 135 lb (61.2 kg)   SpO2 95% Comment: ON RA  BMI 24.18 kg/m  71 year old woman in no acute distress Alert and oriented x 3 with no focal deficits Lungs clear with equal breath sounds bilaterally Cardiac regular rate and rhythm normal S1 and S2 no rubs murmurs or gallops  Diagnostic Tests: CT  ANGIOGRAPHY CHEST WITH CONTRAST  TECHNIQUE: Multidetector CT imaging of the chest was performed using the standard protocol during bolus administration of intravenous contrast. Multiplanar CT image reconstructions and MIPs were obtained to evaluate the vascular anatomy.  CONTRAST:  75 cc Isovue 370  COMPARISON:  08/15/2016  FINDINGS: Cardiovascular: The heart is normal in size. No pericardial effusion. Stable tortuosity and ectasia of the thoracic aorta with minimal scattered atherosclerotic calcifications. Stable fusiform aneurysmal dilatation of the ascending aorta with maximum measurement of 4.1 cm. No dissection. Stable scattered coronary artery calcifications.  Mediastinum/Nodes: No mediastinal or hilar mass or lymphadenopathy. Esophagus is grossly normal.  Lungs/Pleura: Stable mild emphysematous changes. No worrisome pulmonary lesions. Minimal areas of subpleural atelectasis.  Upper Abdomen: Stable surgical changes at the GE junction. Suspect slipped Nissen fundoplication.  Musculoskeletal: No breast masses. Bilateral breast prosthesis. No supraclavicular or axillary lymphadenopathy. Thyroid gland is grossly normal. No significant bony findings.  Review of the MIP images confirms the above findings.  IMPRESSION: 1. Stable fusiform aneurysmal dilatation of the ascending thoracic aorta with maximum diameter of 4.1 cm. No change since 2015. 2. Stable scattered coronary artery calcifications. 3. No significant pulmonary findings. 4. Stable mild emphysematous changes.  Aortic Atherosclerosis (ICD10-I70.0) and Emphysema (ICD10-J43.9).   Electronically Signed  By: Marijo Sanes M.D.   On: 07/18/2017 15:01 I personally reviewed the Ct images and concur with the findings noted above  Impression: 71 year old woman with a stable 4.1 cm ascending aneurysm and multiple stable lung nodules.  Lung nodules- have been stable over several years now. These are  benign.  Ascending aneurysm- stable at 4.1 cm. No indication for surgery. Needs continued annual follow-up.  Plan: Return in one year with CT of chest  Melrose Nakayama, MD [facility] [phone]

## 2017-07-19 DIAGNOSIS — Z01818 Encounter for other preprocedural examination: Secondary | ICD-10-CM | POA: Diagnosis not present

## 2017-07-20 DIAGNOSIS — I10 Essential (primary) hypertension: Secondary | ICD-10-CM | POA: Diagnosis not present

## 2017-07-20 DIAGNOSIS — K219 Gastro-esophageal reflux disease without esophagitis: Secondary | ICD-10-CM | POA: Diagnosis not present

## 2017-07-20 DIAGNOSIS — N200 Calculus of kidney: Secondary | ICD-10-CM | POA: Diagnosis not present

## 2017-07-20 DIAGNOSIS — I714 Abdominal aortic aneurysm, without rupture: Secondary | ICD-10-CM | POA: Diagnosis not present

## 2017-07-21 DIAGNOSIS — R319 Hematuria, unspecified: Secondary | ICD-10-CM | POA: Diagnosis not present

## 2017-08-04 DIAGNOSIS — N200 Calculus of kidney: Secondary | ICD-10-CM | POA: Diagnosis not present

## 2017-08-21 DIAGNOSIS — N200 Calculus of kidney: Secondary | ICD-10-CM | POA: Diagnosis not present

## 2017-08-21 DIAGNOSIS — Z01818 Encounter for other preprocedural examination: Secondary | ICD-10-CM | POA: Diagnosis not present

## 2017-08-24 DIAGNOSIS — Z87440 Personal history of urinary (tract) infections: Secondary | ICD-10-CM | POA: Diagnosis not present

## 2017-08-24 DIAGNOSIS — N200 Calculus of kidney: Secondary | ICD-10-CM | POA: Diagnosis not present

## 2017-08-27 DIAGNOSIS — K5732 Diverticulitis of large intestine without perforation or abscess without bleeding: Secondary | ICD-10-CM | POA: Diagnosis not present

## 2017-08-27 DIAGNOSIS — K5792 Diverticulitis of intestine, part unspecified, without perforation or abscess without bleeding: Secondary | ICD-10-CM | POA: Diagnosis not present

## 2017-08-27 DIAGNOSIS — R1032 Left lower quadrant pain: Secondary | ICD-10-CM | POA: Diagnosis not present

## 2017-08-27 DIAGNOSIS — K5730 Diverticulosis of large intestine without perforation or abscess without bleeding: Secondary | ICD-10-CM | POA: Diagnosis not present

## 2017-08-27 DIAGNOSIS — N200 Calculus of kidney: Secondary | ICD-10-CM | POA: Diagnosis not present

## 2017-09-08 DIAGNOSIS — N200 Calculus of kidney: Secondary | ICD-10-CM | POA: Diagnosis not present

## 2017-09-12 DIAGNOSIS — N200 Calculus of kidney: Secondary | ICD-10-CM | POA: Diagnosis not present

## 2017-10-20 DIAGNOSIS — R82991 Hypocitraturia: Secondary | ICD-10-CM | POA: Diagnosis not present

## 2017-10-20 DIAGNOSIS — R82992 Hyperoxaluria: Secondary | ICD-10-CM | POA: Diagnosis not present

## 2017-11-09 DIAGNOSIS — H04123 Dry eye syndrome of bilateral lacrimal glands: Secondary | ICD-10-CM | POA: Diagnosis not present

## 2017-11-09 DIAGNOSIS — H35033 Hypertensive retinopathy, bilateral: Secondary | ICD-10-CM | POA: Diagnosis not present

## 2017-11-09 DIAGNOSIS — H43813 Vitreous degeneration, bilateral: Secondary | ICD-10-CM | POA: Diagnosis not present

## 2017-11-09 DIAGNOSIS — H40013 Open angle with borderline findings, low risk, bilateral: Secondary | ICD-10-CM | POA: Diagnosis not present

## 2017-11-17 DIAGNOSIS — I10 Essential (primary) hypertension: Secondary | ICD-10-CM | POA: Diagnosis not present

## 2017-11-17 DIAGNOSIS — E559 Vitamin D deficiency, unspecified: Secondary | ICD-10-CM | POA: Diagnosis not present

## 2017-11-17 DIAGNOSIS — R51 Headache: Secondary | ICD-10-CM | POA: Diagnosis not present

## 2017-11-17 DIAGNOSIS — E785 Hyperlipidemia, unspecified: Secondary | ICD-10-CM | POA: Diagnosis not present

## 2017-11-17 DIAGNOSIS — K449 Diaphragmatic hernia without obstruction or gangrene: Secondary | ICD-10-CM | POA: Diagnosis not present

## 2017-11-27 DIAGNOSIS — M859 Disorder of bone density and structure, unspecified: Secondary | ICD-10-CM | POA: Diagnosis not present

## 2017-12-21 DIAGNOSIS — R042 Hemoptysis: Secondary | ICD-10-CM | POA: Diagnosis not present

## 2017-12-21 DIAGNOSIS — R197 Diarrhea, unspecified: Secondary | ICD-10-CM | POA: Diagnosis not present

## 2017-12-21 DIAGNOSIS — I10 Essential (primary) hypertension: Secondary | ICD-10-CM | POA: Diagnosis not present

## 2017-12-26 DIAGNOSIS — R197 Diarrhea, unspecified: Secondary | ICD-10-CM | POA: Diagnosis not present

## 2018-01-17 DIAGNOSIS — F418 Other specified anxiety disorders: Secondary | ICD-10-CM | POA: Diagnosis not present

## 2018-01-17 DIAGNOSIS — R072 Precordial pain: Secondary | ICD-10-CM | POA: Diagnosis not present

## 2018-01-17 DIAGNOSIS — R197 Diarrhea, unspecified: Secondary | ICD-10-CM | POA: Diagnosis not present

## 2018-02-12 DIAGNOSIS — N200 Calculus of kidney: Secondary | ICD-10-CM | POA: Diagnosis not present

## 2018-02-13 DIAGNOSIS — Z8262 Family history of osteoporosis: Secondary | ICD-10-CM | POA: Diagnosis not present

## 2018-02-13 DIAGNOSIS — M810 Age-related osteoporosis without current pathological fracture: Secondary | ICD-10-CM | POA: Diagnosis not present

## 2018-02-20 DIAGNOSIS — M810 Age-related osteoporosis without current pathological fracture: Secondary | ICD-10-CM | POA: Diagnosis not present

## 2018-03-09 DIAGNOSIS — M810 Age-related osteoporosis without current pathological fracture: Secondary | ICD-10-CM | POA: Diagnosis not present

## 2018-03-09 DIAGNOSIS — R5381 Other malaise: Secondary | ICD-10-CM | POA: Diagnosis not present

## 2018-03-13 DIAGNOSIS — Z5112 Encounter for antineoplastic immunotherapy: Secondary | ICD-10-CM | POA: Diagnosis not present

## 2018-03-13 DIAGNOSIS — M810 Age-related osteoporosis without current pathological fracture: Secondary | ICD-10-CM | POA: Diagnosis not present

## 2018-03-21 DIAGNOSIS — L821 Other seborrheic keratosis: Secondary | ICD-10-CM | POA: Diagnosis not present

## 2018-03-21 DIAGNOSIS — L720 Epidermal cyst: Secondary | ICD-10-CM | POA: Diagnosis not present

## 2018-03-21 DIAGNOSIS — L905 Scar conditions and fibrosis of skin: Secondary | ICD-10-CM | POA: Diagnosis not present

## 2018-03-21 DIAGNOSIS — L7211 Pilar cyst: Secondary | ICD-10-CM | POA: Diagnosis not present

## 2018-04-18 DIAGNOSIS — L728 Other follicular cysts of the skin and subcutaneous tissue: Secondary | ICD-10-CM | POA: Diagnosis not present

## 2018-04-18 DIAGNOSIS — L7211 Pilar cyst: Secondary | ICD-10-CM | POA: Diagnosis not present

## 2018-05-16 DIAGNOSIS — R1310 Dysphagia, unspecified: Secondary | ICD-10-CM | POA: Diagnosis not present

## 2018-05-16 DIAGNOSIS — K219 Gastro-esophageal reflux disease without esophagitis: Secondary | ICD-10-CM | POA: Diagnosis not present

## 2018-05-16 DIAGNOSIS — K449 Diaphragmatic hernia without obstruction or gangrene: Secondary | ICD-10-CM | POA: Diagnosis not present

## 2018-05-22 DIAGNOSIS — R1310 Dysphagia, unspecified: Secondary | ICD-10-CM | POA: Diagnosis not present

## 2018-05-22 DIAGNOSIS — K449 Diaphragmatic hernia without obstruction or gangrene: Secondary | ICD-10-CM | POA: Diagnosis not present

## 2018-06-06 ENCOUNTER — Other Ambulatory Visit: Payer: Self-pay | Admitting: Thoracic Surgery (Cardiothoracic Vascular Surgery)

## 2018-06-06 DIAGNOSIS — I712 Thoracic aortic aneurysm, without rupture, unspecified: Secondary | ICD-10-CM

## 2018-06-12 DIAGNOSIS — N200 Calculus of kidney: Secondary | ICD-10-CM | POA: Diagnosis not present

## 2018-06-18 ENCOUNTER — Ambulatory Visit
Admission: RE | Admit: 2018-06-18 | Discharge: 2018-06-18 | Disposition: A | Discharge status: Home / Self Care | Payer: Medicare Other | Source: Ambulatory Visit | Attending: Gastroenterology | Admitting: Gastroenterology

## 2018-06-18 ENCOUNTER — Other Ambulatory Visit: Payer: Self-pay | Admitting: Gastroenterology

## 2018-06-18 DIAGNOSIS — R1310 Dysphagia, unspecified: Secondary | ICD-10-CM

## 2018-06-18 DIAGNOSIS — R0789 Other chest pain: Secondary | ICD-10-CM

## 2018-06-18 DIAGNOSIS — R079 Chest pain, unspecified: Secondary | ICD-10-CM | POA: Diagnosis not present

## 2018-06-27 DIAGNOSIS — E7800 Pure hypercholesterolemia, unspecified: Secondary | ICD-10-CM | POA: Diagnosis not present

## 2018-06-27 DIAGNOSIS — R5382 Chronic fatigue, unspecified: Secondary | ICD-10-CM | POA: Diagnosis not present

## 2018-06-27 DIAGNOSIS — Z79899 Other long term (current) drug therapy: Secondary | ICD-10-CM | POA: Diagnosis not present

## 2018-06-27 DIAGNOSIS — E782 Mixed hyperlipidemia: Secondary | ICD-10-CM | POA: Diagnosis not present

## 2018-07-10 ENCOUNTER — Ambulatory Visit
Admission: RE | Admit: 2018-07-10 | Discharge: 2018-07-10 | Disposition: A | Discharge status: Home / Self Care | Payer: Medicare Other | Source: Ambulatory Visit | Attending: Thoracic Surgery (Cardiothoracic Vascular Surgery) | Admitting: Thoracic Surgery (Cardiothoracic Vascular Surgery)

## 2018-07-10 ENCOUNTER — Encounter: Payer: Self-pay | Admitting: Thoracic Surgery (Cardiothoracic Vascular Surgery)

## 2018-07-10 ENCOUNTER — Ambulatory Visit (INDEPENDENT_AMBULATORY_CARE_PROVIDER_SITE_OTHER): Payer: Medicare Other | Admitting: Thoracic Surgery (Cardiothoracic Vascular Surgery)

## 2018-07-10 VITALS — BP 122/69 | HR 69 | Resp 20 | Ht 62.5 in | Wt 144.0 lb

## 2018-07-10 DIAGNOSIS — I712 Thoracic aortic aneurysm, without rupture, unspecified: Secondary | ICD-10-CM

## 2018-07-10 DIAGNOSIS — R911 Solitary pulmonary nodule: Secondary | ICD-10-CM | POA: Diagnosis not present

## 2018-07-10 DIAGNOSIS — I7121 Aneurysm of the ascending aorta, without rupture: Secondary | ICD-10-CM

## 2018-07-10 NOTE — Progress Notes (Signed)
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  These were first noted back in 2009.  The aneurysm was about 4 cm at that time.  She had multiple small lung nodules that have remained stable over time.  Her past medical history is also significant for breast cancer with bilateral mastectomies, reflux status post surgical repair, hiatal hernia, hypertension, and osteoporosis.   She has been doing well.  She is not having any chest pain, pressure, or tightness.  She does not have any shortness of breath.  She does walk on a regular basis.  She and her husband still live in [place] [facility] but come back to this area frequently.  She has been having sensation of food sticking when she swallows.  She had an upper endoscopy which was unrevealing.  She has had a previous fundoplication.  She has an appointment with a specialist at [facility] in December.  She also said that she stopped breathing when they did her endoscopy.  She is concerned she may have sleep apnea.  She had a couple of sleep studies done here but they were more than 15 years ago.  Past Medical History:  Diagnosis Date  . Breast cancer [facility])    s/p bilateral mastectomies  . GERD (gastroesophageal reflux disease)    history of  . Hiatal hernia   . HTN (hypertension)   . Kidney stones   . Osteoporosis   . Thoracic aortic aneurysm (HCC)    Dr. Roxan Hockey    Current Outpatient Medications  Medication Sig Dispense Refill  . acetaminophen (TYLENOL) 500 MG tablet Take 1,000 mg by mouth every 6 (six) hours as needed for moderate pain (back pain).     Marland Kitchen amLODipine-benazepril (LOTREL) 5-10 MG per capsule Take 1 capsule by mouth daily.    Marland Kitchen denosumab (PROLIA) 60 MG/ML SOSY injection Inject 60 mg into the skin every 6 (six)  months.    Marland Kitchen FLUoxetine (PROZAC) 20 MG tablet Take 20 mg by mouth daily.    . Red Yeast Rice Extract (RED YEAST RICE PO) Take by mouth daily.     No current facility-administered medications for this visit.     Physical Exam BP 122/69   Pulse 69   Resp 20   Ht 5' 2.5" (1.588 m)   Wt 144 lb (65.3 kg)   SpO2 98% Comment: RA  BMI 25.5 kg/m  72 year old woman in no acute distress Alert and oriented x3 with no focal deficits No carotid bruits Cardiac regular rate and rhythm, normal S1 and S2, no rubs or murmurs Lungs clear with equal breath sounds bilaterally Peripheral pulses intact and symmetrical  Diagnostic Tests: CT CHEST WITHOUT CONTRAST  TECHNIQUE: Multidetector CT imaging of the chest was performed following the standard protocol without IV contrast.  COMPARISON:  07/18/2017  FINDINGS: Cardiovascular: Heart size appears normal. No pericardial effusion. 4.1 cm ascending thoracic aortic aneurysm is unchanged in size from previous exam. At the level of the anterior arch the aorta measures 2.6 cm. The descending thoracic aorta at the level of the hiatus measures 2.8 cm. Calcification in the LAD coronary artery noted.  Mediastinum/Nodes: Normal appearance of the thyroid gland. The trachea appears patent  and is midline. No enlarged axillary or mediastinal lymph nodes. Normal appearance of the thyroid gland. The trachea appears patent and is midline.  Lungs/Pleura: No pleural effusion, airspace consolidation, or atelectasis. Subpleural nodule in the posterior left upper lobe is unchanged measuring 4 mm, image 18/8. Unchanged perifissural nodule within the right lower lobe measuring 6 mm, image 83/4. Stable small lateral right lower lobe lung nodule measuring 2 mm, image 64/8.  Upper Abdomen: No acute abnormality.  Musculoskeletal: No chest wall mass or suspicious bone lesions identified.  IMPRESSION: 1. Stable exam. No significant change in ascending  thoracic aortic aneurysm measuring 4.1 cm. Recommend annual imaging followup by CTA or MRA. This recommendation follows 2010 ACCF/AHA/AATS/ACR/ASA/SCA/SCAI/SIR/STS/SVM Guidelines for the Diagnosis and Management of Patients with Thoracic Aortic Disease. Circulation. 2010; 121: K562-B638 2. Small pulmonary nodules in the right lung are stable. 3.  Aortic Atherosclerosis (ICD10-I70.0). 4. Lad coronary artery atherosclerotic calcifications.   Electronically Signed   By: Kerby Moors M.D.   On: 07/10/2018 15:42 I personally reviewed the CT images and concur with the findings noted above  Impression: [name] is a 72 year old woman with a history of an ascending thoracic aneurysm that was first discovered in 2010.  It has remained stable since that time and currently measures about 4.1 cm.  She needs continued annual follow-up.  Hypertension-blood pressure currently well controlled.  Lung nodules-stable consistent with subpleural lymph nodes.  No need for additional follow-up but will be visualized on CT is done to follow the aneurysm  Possible sleep apnea-recommend that she check with her primary in [facility] area to consider doing another sleep study  Plan: Return in 1 year with CT angiogram chest  Jessica Nakayama, MD [facility] [phone]

## 2018-07-17 DIAGNOSIS — I10 Essential (primary) hypertension: Secondary | ICD-10-CM | POA: Diagnosis not present

## 2018-07-17 DIAGNOSIS — K219 Gastro-esophageal reflux disease without esophagitis: Secondary | ICD-10-CM | POA: Diagnosis not present

## 2018-07-17 DIAGNOSIS — Z1211 Encounter for screening for malignant neoplasm of colon: Secondary | ICD-10-CM | POA: Diagnosis not present

## 2018-07-17 DIAGNOSIS — F330 Major depressive disorder, recurrent, mild: Secondary | ICD-10-CM | POA: Diagnosis not present

## 2018-07-19 DIAGNOSIS — M25551 Pain in right hip: Secondary | ICD-10-CM | POA: Diagnosis not present

## 2018-08-29 DIAGNOSIS — Z823 Family history of stroke: Secondary | ICD-10-CM | POA: Diagnosis not present

## 2018-08-29 DIAGNOSIS — I712 Thoracic aortic aneurysm, without rupture: Secondary | ICD-10-CM | POA: Diagnosis not present

## 2018-08-29 DIAGNOSIS — K449 Diaphragmatic hernia without obstruction or gangrene: Secondary | ICD-10-CM | POA: Diagnosis not present

## 2018-08-29 DIAGNOSIS — F458 Other somatoform disorders: Secondary | ICD-10-CM | POA: Diagnosis not present

## 2018-08-29 DIAGNOSIS — R0989 Other specified symptoms and signs involving the circulatory and respiratory systems: Secondary | ICD-10-CM | POA: Diagnosis not present

## 2018-08-29 DIAGNOSIS — Z833 Family history of diabetes mellitus: Secondary | ICD-10-CM | POA: Diagnosis not present

## 2018-09-13 DIAGNOSIS — Z634 Disappearance and death of family member: Secondary | ICD-10-CM | POA: Diagnosis not present

## 2018-09-13 DIAGNOSIS — R1310 Dysphagia, unspecified: Secondary | ICD-10-CM | POA: Diagnosis not present

## 2018-09-13 DIAGNOSIS — Z79899 Other long term (current) drug therapy: Secondary | ICD-10-CM | POA: Diagnosis not present

## 2018-09-13 DIAGNOSIS — R0989 Other specified symptoms and signs involving the circulatory and respiratory systems: Secondary | ICD-10-CM | POA: Diagnosis not present

## 2018-09-13 DIAGNOSIS — I719 Aortic aneurysm of unspecified site, without rupture: Secondary | ICD-10-CM | POA: Diagnosis not present

## 2018-09-13 DIAGNOSIS — R1313 Dysphagia, pharyngeal phase: Secondary | ICD-10-CM | POA: Diagnosis not present

## 2018-09-13 DIAGNOSIS — R1312 Dysphagia, oropharyngeal phase: Secondary | ICD-10-CM | POA: Diagnosis not present

## 2018-10-02 DIAGNOSIS — R5381 Other malaise: Secondary | ICD-10-CM | POA: Diagnosis not present

## 2018-10-02 DIAGNOSIS — M810 Age-related osteoporosis without current pathological fracture: Secondary | ICD-10-CM | POA: Diagnosis not present

## 2018-10-09 DIAGNOSIS — Z5112 Encounter for antineoplastic immunotherapy: Secondary | ICD-10-CM | POA: Diagnosis not present

## 2018-10-09 DIAGNOSIS — M810 Age-related osteoporosis without current pathological fracture: Secondary | ICD-10-CM | POA: Diagnosis not present

## 2018-10-10 DIAGNOSIS — M5416 Radiculopathy, lumbar region: Secondary | ICD-10-CM | POA: Diagnosis not present

## 2018-10-10 DIAGNOSIS — M545 Low back pain: Secondary | ICD-10-CM | POA: Diagnosis not present

## 2018-10-16 DIAGNOSIS — M5416 Radiculopathy, lumbar region: Secondary | ICD-10-CM | POA: Diagnosis not present

## 2018-10-16 DIAGNOSIS — M5126 Other intervertebral disc displacement, lumbar region: Secondary | ICD-10-CM | POA: Diagnosis not present

## 2018-10-16 DIAGNOSIS — M47816 Spondylosis without myelopathy or radiculopathy, lumbar region: Secondary | ICD-10-CM | POA: Diagnosis not present

## 2018-10-25 DIAGNOSIS — K222 Esophageal obstruction: Secondary | ICD-10-CM | POA: Diagnosis not present

## 2018-10-25 DIAGNOSIS — R490 Dysphonia: Secondary | ICD-10-CM | POA: Diagnosis not present

## 2018-10-25 DIAGNOSIS — R0989 Other specified symptoms and signs involving the circulatory and respiratory systems: Secondary | ICD-10-CM | POA: Diagnosis not present

## 2018-10-25 DIAGNOSIS — K449 Diaphragmatic hernia without obstruction or gangrene: Secondary | ICD-10-CM | POA: Diagnosis not present

## 2018-10-25 DIAGNOSIS — R070 Pain in throat: Secondary | ICD-10-CM | POA: Diagnosis not present

## 2018-10-25 DIAGNOSIS — R1312 Dysphagia, oropharyngeal phase: Secondary | ICD-10-CM | POA: Diagnosis not present

## 2018-10-25 DIAGNOSIS — R1314 Dysphagia, pharyngoesophageal phase: Secondary | ICD-10-CM | POA: Diagnosis not present

## 2018-10-25 DIAGNOSIS — F458 Other somatoform disorders: Secondary | ICD-10-CM | POA: Diagnosis not present

## 2018-10-29 DIAGNOSIS — M545 Low back pain: Secondary | ICD-10-CM | POA: Diagnosis not present

## 2018-10-29 DIAGNOSIS — M47896 Other spondylosis, lumbar region: Secondary | ICD-10-CM | POA: Diagnosis not present

## 2018-10-29 DIAGNOSIS — M5416 Radiculopathy, lumbar region: Secondary | ICD-10-CM | POA: Diagnosis not present

## 2018-10-30 DIAGNOSIS — N281 Cyst of kidney, acquired: Secondary | ICD-10-CM | POA: Diagnosis not present

## 2018-11-20 DIAGNOSIS — M79661 Pain in right lower leg: Secondary | ICD-10-CM | POA: Diagnosis not present

## 2018-11-21 DIAGNOSIS — M5416 Radiculopathy, lumbar region: Secondary | ICD-10-CM | POA: Diagnosis not present

## 2018-11-21 DIAGNOSIS — M545 Low back pain: Secondary | ICD-10-CM | POA: Diagnosis not present

## 2018-11-21 DIAGNOSIS — M47896 Other spondylosis, lumbar region: Secondary | ICD-10-CM | POA: Diagnosis not present

## 2018-11-28 DIAGNOSIS — R0989 Other specified symptoms and signs involving the circulatory and respiratory systems: Secondary | ICD-10-CM | POA: Diagnosis not present

## 2018-11-28 DIAGNOSIS — R070 Pain in throat: Secondary | ICD-10-CM | POA: Diagnosis not present

## 2018-11-28 DIAGNOSIS — R1312 Dysphagia, oropharyngeal phase: Secondary | ICD-10-CM | POA: Diagnosis not present

## 2018-11-28 DIAGNOSIS — R1310 Dysphagia, unspecified: Secondary | ICD-10-CM | POA: Diagnosis not present

## 2019-02-11 DIAGNOSIS — H40013 Open angle with borderline findings, low risk, bilateral: Secondary | ICD-10-CM | POA: Diagnosis not present

## 2019-02-11 DIAGNOSIS — H35033 Hypertensive retinopathy, bilateral: Secondary | ICD-10-CM | POA: Diagnosis not present

## 2019-02-11 DIAGNOSIS — H43813 Vitreous degeneration, bilateral: Secondary | ICD-10-CM | POA: Diagnosis not present

## 2019-02-11 DIAGNOSIS — Z961 Presence of intraocular lens: Secondary | ICD-10-CM | POA: Diagnosis not present

## 2019-02-21 DIAGNOSIS — J069 Acute upper respiratory infection, unspecified: Secondary | ICD-10-CM | POA: Diagnosis not present

## 2019-02-21 DIAGNOSIS — Z20828 Contact with and (suspected) exposure to other viral communicable diseases: Secondary | ICD-10-CM | POA: Diagnosis not present

## 2019-02-28 DIAGNOSIS — R1310 Dysphagia, unspecified: Secondary | ICD-10-CM | POA: Diagnosis not present

## 2019-02-28 DIAGNOSIS — R0989 Other specified symptoms and signs involving the circulatory and respiratory systems: Secondary | ICD-10-CM | POA: Diagnosis not present

## 2019-02-28 DIAGNOSIS — K228 Other specified diseases of esophagus: Secondary | ICD-10-CM | POA: Diagnosis not present

## 2019-02-28 DIAGNOSIS — K222 Esophageal obstruction: Secondary | ICD-10-CM | POA: Diagnosis not present

## 2019-03-01 DIAGNOSIS — R05 Cough: Secondary | ICD-10-CM | POA: Diagnosis not present

## 2019-03-01 DIAGNOSIS — R1110 Vomiting, unspecified: Secondary | ICD-10-CM | POA: Diagnosis not present

## 2019-03-01 DIAGNOSIS — R0989 Other specified symptoms and signs involving the circulatory and respiratory systems: Secondary | ICD-10-CM | POA: Diagnosis not present

## 2019-04-09 DIAGNOSIS — R5381 Other malaise: Secondary | ICD-10-CM | POA: Diagnosis not present

## 2019-04-11 DIAGNOSIS — Z1159 Encounter for screening for other viral diseases: Secondary | ICD-10-CM | POA: Diagnosis not present

## 2019-04-11 DIAGNOSIS — K219 Gastro-esophageal reflux disease without esophagitis: Secondary | ICD-10-CM | POA: Diagnosis not present

## 2019-04-11 DIAGNOSIS — Z01812 Encounter for preprocedural laboratory examination: Secondary | ICD-10-CM | POA: Diagnosis not present

## 2019-04-16 DIAGNOSIS — Z5112 Encounter for antineoplastic immunotherapy: Secondary | ICD-10-CM | POA: Diagnosis not present

## 2019-04-16 DIAGNOSIS — M810 Age-related osteoporosis without current pathological fracture: Secondary | ICD-10-CM | POA: Diagnosis not present

## 2019-04-18 DIAGNOSIS — K222 Esophageal obstruction: Secondary | ICD-10-CM | POA: Diagnosis not present

## 2019-04-18 DIAGNOSIS — K449 Diaphragmatic hernia without obstruction or gangrene: Secondary | ICD-10-CM | POA: Diagnosis not present

## 2019-04-18 DIAGNOSIS — R1319 Other dysphagia: Secondary | ICD-10-CM | POA: Diagnosis not present

## 2019-04-18 DIAGNOSIS — T819XXA Unspecified complication of procedure, initial encounter: Secondary | ICD-10-CM | POA: Diagnosis not present

## 2019-04-18 DIAGNOSIS — R1310 Dysphagia, unspecified: Secondary | ICD-10-CM | POA: Diagnosis not present

## 2019-04-18 DIAGNOSIS — K9509 Other complications of gastric band procedure: Secondary | ICD-10-CM | POA: Diagnosis not present

## 2019-04-19 DIAGNOSIS — R1310 Dysphagia, unspecified: Secondary | ICD-10-CM | POA: Diagnosis not present

## 2019-04-19 DIAGNOSIS — K449 Diaphragmatic hernia without obstruction or gangrene: Secondary | ICD-10-CM | POA: Diagnosis not present

## 2019-05-08 DIAGNOSIS — R1310 Dysphagia, unspecified: Secondary | ICD-10-CM | POA: Diagnosis not present

## 2019-05-08 DIAGNOSIS — R0989 Other specified symptoms and signs involving the circulatory and respiratory systems: Secondary | ICD-10-CM | POA: Diagnosis not present

## 2019-05-08 DIAGNOSIS — R1312 Dysphagia, oropharyngeal phase: Secondary | ICD-10-CM | POA: Diagnosis not present

## 2019-05-18 DIAGNOSIS — S8001XA Contusion of right knee, initial encounter: Secondary | ICD-10-CM | POA: Diagnosis not present

## 2019-05-18 DIAGNOSIS — S0993XA Unspecified injury of face, initial encounter: Secondary | ICD-10-CM | POA: Diagnosis not present

## 2019-05-18 DIAGNOSIS — R0781 Pleurodynia: Secondary | ICD-10-CM | POA: Diagnosis not present

## 2019-05-18 DIAGNOSIS — M25511 Pain in right shoulder: Secondary | ICD-10-CM | POA: Diagnosis not present

## 2019-05-18 DIAGNOSIS — M25561 Pain in right knee: Secondary | ICD-10-CM | POA: Diagnosis not present

## 2019-05-18 DIAGNOSIS — S20211A Contusion of right front wall of thorax, initial encounter: Secondary | ICD-10-CM | POA: Diagnosis not present

## 2019-05-24 DIAGNOSIS — N200 Calculus of kidney: Secondary | ICD-10-CM | POA: Diagnosis not present

## 2019-05-24 DIAGNOSIS — R1084 Generalized abdominal pain: Secondary | ICD-10-CM | POA: Diagnosis not present

## 2019-05-24 DIAGNOSIS — N132 Hydronephrosis with renal and ureteral calculous obstruction: Secondary | ICD-10-CM | POA: Diagnosis not present

## 2019-05-24 DIAGNOSIS — R319 Hematuria, unspecified: Secondary | ICD-10-CM | POA: Diagnosis not present

## 2019-05-26 DIAGNOSIS — R319 Hematuria, unspecified: Secondary | ICD-10-CM | POA: Diagnosis not present

## 2019-05-27 ENCOUNTER — Other Ambulatory Visit: Payer: Self-pay | Admitting: Thoracic Surgery (Cardiothoracic Vascular Surgery)

## 2019-05-27 DIAGNOSIS — I712 Thoracic aortic aneurysm, without rupture, unspecified: Secondary | ICD-10-CM

## 2019-05-28 DIAGNOSIS — R310 Gross hematuria: Secondary | ICD-10-CM | POA: Diagnosis not present

## 2019-05-28 DIAGNOSIS — N209 Urinary calculus, unspecified: Secondary | ICD-10-CM | POA: Diagnosis not present

## 2019-05-28 DIAGNOSIS — N201 Calculus of ureter: Secondary | ICD-10-CM | POA: Diagnosis not present

## 2019-05-31 DIAGNOSIS — Z853 Personal history of malignant neoplasm of breast: Secondary | ICD-10-CM | POA: Diagnosis not present

## 2019-05-31 DIAGNOSIS — N202 Calculus of kidney with calculus of ureter: Secondary | ICD-10-CM | POA: Diagnosis not present

## 2019-05-31 DIAGNOSIS — Z87442 Personal history of urinary calculi: Secondary | ICD-10-CM | POA: Diagnosis not present

## 2019-05-31 DIAGNOSIS — N200 Calculus of kidney: Secondary | ICD-10-CM | POA: Diagnosis not present

## 2019-05-31 DIAGNOSIS — I10 Essential (primary) hypertension: Secondary | ICD-10-CM | POA: Diagnosis not present

## 2019-06-06 DIAGNOSIS — I10 Essential (primary) hypertension: Secondary | ICD-10-CM | POA: Diagnosis not present

## 2019-06-06 DIAGNOSIS — N202 Calculus of kidney with calculus of ureter: Secondary | ICD-10-CM | POA: Diagnosis not present

## 2019-06-06 DIAGNOSIS — N200 Calculus of kidney: Secondary | ICD-10-CM | POA: Diagnosis not present

## 2019-06-06 DIAGNOSIS — K449 Diaphragmatic hernia without obstruction or gangrene: Secondary | ICD-10-CM | POA: Diagnosis not present

## 2019-06-07 DIAGNOSIS — N3289 Other specified disorders of bladder: Secondary | ICD-10-CM | POA: Diagnosis not present

## 2019-06-07 DIAGNOSIS — N132 Hydronephrosis with renal and ureteral calculous obstruction: Secondary | ICD-10-CM | POA: Diagnosis not present

## 2019-06-07 DIAGNOSIS — M545 Low back pain: Secondary | ICD-10-CM | POA: Diagnosis not present

## 2019-06-07 DIAGNOSIS — N23 Unspecified renal colic: Secondary | ICD-10-CM | POA: Diagnosis not present

## 2019-06-07 DIAGNOSIS — R319 Hematuria, unspecified: Secondary | ICD-10-CM | POA: Diagnosis not present

## 2019-06-07 DIAGNOSIS — R1030 Lower abdominal pain, unspecified: Secondary | ICD-10-CM | POA: Diagnosis not present

## 2019-06-18 DIAGNOSIS — N209 Urinary calculus, unspecified: Secondary | ICD-10-CM | POA: Diagnosis not present

## 2019-06-21 DIAGNOSIS — N202 Calculus of kidney with calculus of ureter: Secondary | ICD-10-CM | POA: Diagnosis not present

## 2019-07-11 ENCOUNTER — Other Ambulatory Visit: Payer: Medicare Other

## 2019-07-15 DIAGNOSIS — R0989 Other specified symptoms and signs involving the circulatory and respiratory systems: Secondary | ICD-10-CM | POA: Diagnosis not present

## 2019-07-15 DIAGNOSIS — R1310 Dysphagia, unspecified: Secondary | ICD-10-CM | POA: Diagnosis not present

## 2019-07-16 ENCOUNTER — Encounter: Payer: Self-pay | Admitting: Thoracic Surgery (Cardiothoracic Vascular Surgery)

## 2019-07-16 ENCOUNTER — Ambulatory Visit (INDEPENDENT_AMBULATORY_CARE_PROVIDER_SITE_OTHER): Payer: Medicare Other | Admitting: Thoracic Surgery (Cardiothoracic Vascular Surgery)

## 2019-07-16 ENCOUNTER — Other Ambulatory Visit: Payer: Self-pay

## 2019-07-16 ENCOUNTER — Ambulatory Visit
Admission: RE | Admit: 2019-07-16 | Discharge: 2019-07-16 | Disposition: A | Discharge status: Home / Self Care | Payer: Medicare Other | Source: Ambulatory Visit | Attending: Thoracic Surgery (Cardiothoracic Vascular Surgery) | Admitting: Thoracic Surgery (Cardiothoracic Vascular Surgery)

## 2019-07-16 ENCOUNTER — Other Ambulatory Visit: Payer: Medicare Other

## 2019-07-16 VITALS — BP 104/66 | HR 80 | Temp 97.7°F | Resp 20 | Ht 62.5 in | Wt 140.0 lb

## 2019-07-16 DIAGNOSIS — I712 Thoracic aortic aneurysm, without rupture, unspecified: Secondary | ICD-10-CM

## 2019-07-16 DIAGNOSIS — I7121 Aneurysm of the ascending aorta, without rupture: Secondary | ICD-10-CM

## 2019-07-16 DIAGNOSIS — R911 Solitary pulmonary nodule: Secondary | ICD-10-CM | POA: Diagnosis not present

## 2019-07-16 MED ORDER — IOPAMIDOL (ISOVUE-370) INJECTION 76%
75.0000 mL | Freq: Once | INTRAVENOUS | Status: AC | PRN
  Administered 2019-07-16: 75 mL via INTRAVENOUS

## 2019-07-16 NOTE — Progress Notes (Signed)
[name]       [name],[name] [id number]             [phone]    HPI: [name] returns for an annual follow-up  [name] is a 73 year old woman with a past medical history significant for breast cancer with bilateral mastectomies, hiatal hernia, reflux, hypertension, kidney stones, osteoporosis, and a 4.1 cm ascending aneurysm.  She was first noted to have an ascending aneurysm and multiple small lung nodules in 2009.  The aneurysm was 4 cm at that time.  She has been followed since then.  The lung nodules remain stable and are benign.  The aneurysm has changed minimally.  I last saw her in the office in October 2019.  The aneurysm was stable.  In the interim since that visit she has been doing well.  She denies any chest pain, pressure, or tightness.  She denies shortness of breath.  Past Medical History:  Diagnosis Date  . Breast cancer [facility]    s/p bilateral mastectomies  . GERD (gastroesophageal reflux disease)    history of  . Hiatal hernia   . HTN (hypertension)   . Kidney stones   . Osteoporosis   . Thoracic aortic aneurysm (HCC)    Dr. Roxan Hockey    Current Outpatient Medications  Medication Sig Dispense Refill  . acetaminophen (TYLENOL) 500 MG tablet Take 1,000 mg by mouth every 6 (six) hours as needed for moderate pain (back pain).     Marland Kitchen amLODipine-benazepril (LOTREL) 5-10 MG per capsule Take 1 capsule by mouth daily.    Marland Kitchen denosumab (PROLIA) 60 MG/ML SOSY injection Inject 60 mg into the skin every 6 (six) months.    Marland Kitchen FLUoxetine (PROZAC) 20 MG tablet Take 20 mg by mouth daily.    . Red Yeast Rice Extract (RED YEAST RICE PO) Take by mouth daily.     No current facility-administered medications for this visit.     Physical Exam BP 104/66   Pulse 80   Temp 97.7 F (36.5 C) (Skin)   Resp 20   Ht 5' 2.5" (1.588 m)   Wt 140 lb (63.5 kg)   SpO2 94%   BMI 73.36 kg/m  38-year-old woman in no acute distress Alert and oriented x3 with no  focal deficits No carotid bruits Cardiac regular rate and rhythm normal S1 and S2 no respiratory gallops Lungs clear with equal breath sounds bilaterally No peripheral edema  Diagnostic Tests: CT ANGIOGRAPHY CHEST WITH CONTRAST  TECHNIQUE: Multidetector CT imaging of the chest was performed using the standard protocol during bolus administration of intravenous contrast. Multiplanar CT image reconstructions and MIPs were obtained to evaluate the vascular anatomy.  CONTRAST:  46mL ISOVUE-370 IOPAMIDOL (ISOVUE-370) INJECTION 76%  COMPARISON:  07/10/2018  FINDINGS: Cardiovascular: Heart is normal size. Ascending thoracic aorta currently measuring upper limits normal in size at 3.7 cm maximally. Scattered calcifications in the aortic arch and descending thoracic aorta which are normal caliber. No dissection. No filling defects in the pulmonary arteries to suggest pulmonary emboli.  Mediastinum/Nodes: No mediastinal, hilar, or axillary adenopathy. Trachea and esophagus are unremarkable.  Lungs/Pleura: Linear scarring in the lung bases. No confluent opacities or effusions. Stable subpleural right upper lobe nodule measures 4 mm. No new or enlarging pulmonary nodules.  Upper Abdomen: Imaging into the upper abdomen shows no acute findings.  Musculoskeletal: Bilateral breast implants. Chest wall soft tissues unremarkable. No acute bony abnormality.  Review of the MIP images confirms  the above findings.  IMPRESSION: The ascending thoracic aorta currently measures 3.7 cm, upper limits of normal. No dissection.  No acute cardiopulmonary disease.  Aortic Atherosclerosis (ICD10-I70.0).   Electronically Signed   By: Rolm Baptise M.D.   On: 07/16/2019 10:20 I personally reviewed the CT images.  I disagree with the measurement of the aorta which I get at 4.1 cm maximal diameter.  Unchanged from last year.  Impression: [name] is a 73 year old woman with apast  medical history significant for breast cancer with bilateral mastectomies, hiatal hernia, reflux, hypertension, kidney stones, osteoporosis, and a 4.1 cm ascending aneurysm.  Ascending aneurysm-first noted in 2009 at 4 cm.  Has remained stable over time.  No indication for intervention.  Needs continued annual follow-up.  Hypertension-blood pressure well controlled on current regimen  Lung nodules-subpleural nodules consistent with parenchymal lymph nodes.  No change over 10 years.  Benign.  Plan: Return in 1 year with CT angiogram chest  Melrose Nakayama, MD [facility] [phone]

## 2019-07-18 DIAGNOSIS — I10 Essential (primary) hypertension: Secondary | ICD-10-CM | POA: Diagnosis not present

## 2019-07-18 DIAGNOSIS — Z853 Personal history of malignant neoplasm of breast: Secondary | ICD-10-CM | POA: Diagnosis not present

## 2019-07-18 DIAGNOSIS — E7800 Pure hypercholesterolemia, unspecified: Secondary | ICD-10-CM | POA: Diagnosis not present

## 2019-07-18 DIAGNOSIS — N200 Calculus of kidney: Secondary | ICD-10-CM | POA: Diagnosis not present

## 2019-08-02 DIAGNOSIS — R1310 Dysphagia, unspecified: Secondary | ICD-10-CM | POA: Diagnosis not present

## 2019-08-02 DIAGNOSIS — R0989 Other specified symptoms and signs involving the circulatory and respiratory systems: Secondary | ICD-10-CM | POA: Diagnosis not present

## 2019-08-02 DIAGNOSIS — F458 Other somatoform disorders: Secondary | ICD-10-CM | POA: Diagnosis not present

## 2019-08-09 DIAGNOSIS — R0989 Other specified symptoms and signs involving the circulatory and respiratory systems: Secondary | ICD-10-CM | POA: Diagnosis not present

## 2019-08-15 DIAGNOSIS — I10 Essential (primary) hypertension: Secondary | ICD-10-CM | POA: Diagnosis not present

## 2020-06-16 ENCOUNTER — Other Ambulatory Visit: Payer: Self-pay | Admitting: Thoracic Surgery (Cardiothoracic Vascular Surgery)

## 2020-06-16 DIAGNOSIS — I712 Thoracic aortic aneurysm, without rupture, unspecified: Secondary | ICD-10-CM

## 2020-07-06 ENCOUNTER — Other Ambulatory Visit: Payer: Medicare Other

## 2020-07-10 ENCOUNTER — Other Ambulatory Visit: Payer: Medicare Other

## 2020-07-13 ENCOUNTER — Ambulatory Visit
Admission: RE | Admit: 2020-07-13 | Discharge: 2020-07-13 | Disposition: A | Discharge status: Home / Self Care | Payer: Medicare Other | Source: Ambulatory Visit | Attending: Thoracic Surgery (Cardiothoracic Vascular Surgery) | Admitting: Thoracic Surgery (Cardiothoracic Vascular Surgery)

## 2020-07-13 DIAGNOSIS — I712 Thoracic aortic aneurysm, without rupture, unspecified: Secondary | ICD-10-CM

## 2020-07-13 MED ORDER — IOPAMIDOL (ISOVUE-370) INJECTION 76%
75.0000 mL | Freq: Once | INTRAVENOUS | Status: AC | PRN
  Administered 2020-07-13: 75 mL via INTRAVENOUS

## 2020-07-14 ENCOUNTER — Ambulatory Visit (INDEPENDENT_AMBULATORY_CARE_PROVIDER_SITE_OTHER): Payer: Medicare Other | Admitting: Thoracic Surgery (Cardiothoracic Vascular Surgery)

## 2020-07-14 ENCOUNTER — Other Ambulatory Visit: Payer: Self-pay

## 2020-07-14 ENCOUNTER — Encounter: Payer: Self-pay | Admitting: Thoracic Surgery (Cardiothoracic Vascular Surgery)

## 2020-07-14 VITALS — BP 119/72 | HR 74 | Resp 20 | Ht 62.0 in | Wt 140.0 lb

## 2020-07-14 DIAGNOSIS — I712 Thoracic aortic aneurysm, without rupture: Secondary | ICD-10-CM | POA: Diagnosis not present

## 2020-07-14 DIAGNOSIS — I7121 Aneurysm of the ascending aorta, without rupture: Secondary | ICD-10-CM

## 2020-07-14 NOTE — Progress Notes (Signed)
[address]       [place] [address]             [phone]     HPI: [name] returns for a scheduled follow-up visit  [name] is a 74 year old woman with a history of breast cancer, bilateral mastectomy, hiatal hernia, reflux, hypertension, kidney stones, osteoporosis, and a 4 cm ascending aneurysm.  She was first found to have an ascending aneurysm and multiple small lung nodules back in 2009.  Her aorta was 4 cm at that time.  She has been followed since then with no significant change.  The lung nodules have also remained stable and have been thought to be benign.  I last saw her a year ago.  In the interim since her last visit she has been doing well.  She has had some back issues related to arthritis and also has a kidney stone that bothers her every once in a while.  She is not having any chest pain, pressure, tightness, or shortness of breath.  Past Medical History:  Diagnosis Date   Breast cancer ([place])    s/p bilateral mastectomies   GERD (gastroesophageal reflux disease)    history of   Hiatal hernia    HTN (hypertension)    Kidney stones    Osteoporosis    Thoracic aortic aneurysm ([place])    Dr. Roxan [name]    Current Outpatient Medications  Medication Sig Dispense Refill   acetaminophen (TYLENOL) 500 MG tablet Take 1,000 mg by mouth every 6 (six) hours as needed for moderate pain (back pain).      amLODipine-benazepril (LOTREL) 5-10 MG per capsule Take 1 capsule by mouth daily.     denosumab (PROLIA) 60 MG/ML SOSY injection Inject 60 mg into the skin every 6 (six) months.     FLUoxetine (PROZAC) 20 MG tablet Take 20 mg by mouth daily.     Red Yeast Rice Extract (RED YEAST RICE PO) Take by mouth daily.     No current facility-administered medications for this visit.    Physical Exam BP 119/72 (BP Location: Right Arm, Patient Position: Sitting)    Pulse 74    Resp 20    Ht 5\' 2"  (1.575 m)    Wt 140 lb (63.5 kg)    SpO2 94%  Comment: RA with mask on   BMI 25.71 kg/m  74 year old woman in no acute distress Alert and oriented x3 with no focal deficits Lungs clear with equal breath sounds bilaterally No carotid bruits Cardiac regular rate and rhythm with a normal S1 and S2, no rubs or murmurs No peripheral edema  Diagnostic Tests: CT ANGIOGRAPHY CHEST WITH CONTRAST  TECHNIQUE: Multidetector CT imaging of the chest was performed using the standard protocol during bolus administration of intravenous contrast. Multiplanar CT image reconstructions and MIPs were obtained to evaluate the vascular anatomy.  CONTRAST:  85mL ISOVUE-370 IOPAMIDOL (ISOVUE-370) INJECTION 76%  COMPARISON:  07/16/2019 and 07/10/2018  FINDINGS: Cardiovascular: Ascending thoracic aorta measures 3.9 cm and stable. Negative for an aortic dissection. Limited evaluation of the aortic root due to pulsation artifact. Common trunk for the left common carotid artery and brachiocephalic artery and this is a normal variant. Left vertebral artery originates directly from the aortic arch, distal to the left common carotid artery and this is also a normal variant. Great vessels are widely patent. Proximal descending thoracic aorta measures 2.8 cm and minimally changed. Main pulmonary arteries are patent. No gross abnormality to  the left atrium or pulmonary veins. Normal appearance of the left atrial appendage. Heart size is normal without significant pericardial fluid. Mild atherosclerotic disease along the undersurface of the aortic arch.  Mediastinum/Nodes: No mediastinal or hilar lymphadenopathy. Thyroid tissue is unremarkable. Surgical clip in the left axilla.  Lungs/Pleura: Trachea and mainstem bronchi are patent. Focal irregular density along the right major fissure in the superior segment of the right upper lower lobe on sequence 6, image 41 measures up to 7 mm. This density has been present on multiple examinations but slightly  more conspicuous on this examination. Stable pleural-based nodular density along the posterior right upper lobe measuring 4 mm on sequence 6, image 21. Stable linear density in the anterior right middle lobe on sequence 6, image 87 likely represents scarring. 5 mm nodule in the medial right lower lobe on sequence 6 image 55 has not significantly changed since 07/19/2017. Probable atelectasis at the base of the lingula. No significant airspace disease or consolidation in the lungs. No pleural effusions.  Upper Abdomen: Postsurgical changes compatible with gastric fundoplication. No acute abnormality in the upper abdomen.  Musculoskeletal: No acute bone abnormality. Bilateral breast implants.  Review of the MIP images confirms the above findings.  IMPRESSION: 1. Stable ectasia of the ascending thoracic aorta measuring up to 3.9 cm. No evidence for an aortic dissection. 2. Mild atherosclerotic disease in the thoracic aorta. Aortic Atherosclerosis (ICD10-I70.0). 3. Scattered pleural-based and peri-fissural densities in the lungs. These findings have not significantly changed and recommend continued attention on follow-up imaging.   Electronically Signed   By: [name] M.D.   On: 07/13/2020 09:31 I personally reviewed the CT images and concur with the findings noted above   Impression: [name] is a 74 year old woman with a history of breast cancer, bilateral mastectomy, hiatal hernia, reflux, hypertension, kidney stones, osteoporosis, and a 4 cm ascending aneurysm.  She has been followed for a 4 cm ascending aneurysm for over 10 years now.  It remains stable.  She needs continued annual follow-up.  Subpleural densities noted on lungs bilaterally.  These have been stable over many years.  They are likely subpleural nodes or possibly some areas of scarring.  We will continue to follow on CTs as we follow the aneurysm.  Hypertension-well-controlled on current regimen.   Managed by Dr. Sharlett [name].  Plan: Return in 1 year with CT angio of chest to follow-up ascending aneurysm and lung "densities"  Jessica Nakayama, MD [facility] [phone]

## 2021-06-09 ENCOUNTER — Other Ambulatory Visit: Payer: Self-pay | Admitting: *Deleted

## 2021-06-09 DIAGNOSIS — I712 Thoracic aortic aneurysm, without rupture, unspecified: Secondary | ICD-10-CM

## 2021-06-09 NOTE — Progress Notes (Unsigned)
Ct ac

## 2021-07-15 ENCOUNTER — Encounter: Payer: Self-pay | Admitting: Physician Assistant

## 2021-07-15 ENCOUNTER — Ambulatory Visit
Admission: RE | Admit: 2021-07-15 | Discharge: 2021-07-15 | Disposition: A | Discharge status: Home / Self Care | Payer: Medicare Other | Source: Ambulatory Visit | Attending: Thoracic Surgery (Cardiothoracic Vascular Surgery) | Admitting: Thoracic Surgery (Cardiothoracic Vascular Surgery)

## 2021-07-15 ENCOUNTER — Other Ambulatory Visit: Payer: Self-pay

## 2021-07-15 ENCOUNTER — Ambulatory Visit: Payer: Medicare Other | Admitting: Physician Assistant

## 2021-07-15 VITALS — BP 130/75 | HR 67 | Resp 20 | Ht 62.0 in | Wt 132.2 lb

## 2021-07-15 DIAGNOSIS — I7121 Aneurysm of the ascending aorta, without rupture: Secondary | ICD-10-CM | POA: Diagnosis not present

## 2021-07-15 DIAGNOSIS — I712 Thoracic aortic aneurysm, without rupture, unspecified: Secondary | ICD-10-CM

## 2021-07-15 MED ORDER — IOPAMIDOL (ISOVUE-370) INJECTION 76%
75.0000 mL | Freq: Once | INTRAVENOUS | Status: AC | PRN
  Administered 2021-07-15: 75 mL via INTRAVENOUS

## 2021-07-15 NOTE — Progress Notes (Signed)
[address]       [place], [address]             [phone]     HPI: [name] returns for a scheduled follow-up visit  [name] is a 75 year old woman with a history of breast cancer, bilateral mastectomy, hiatal hernia, reflux, hypertension, kidney stones, osteoporosis, and a 4 cm ascending aneurysm.  She was first found to have an ascending aneurysm and multiple small lung nodules back in 2009.  Her aorta was 4 cm at that time.  She has been followed since then with no significant change.  The lung nodules have also remained stable and have been thought to be benign.  We last saw her a year ago and she was doing well. She has had some back issues related to arthritis and also has a kidney stone that bothers her every once in a while.    Today, she is not having any chest pain, pressure, tightness, or shortness of breath. She has no new medical problems. She had a syncopal episode on Lotrel and was in the ED in June.  This medication was discontinued and she has not had any further episodes.  Past Medical History:  Diagnosis Date   Breast cancer [facility])    s/p bilateral mastectomies   GERD (gastroesophageal reflux disease)    history of   Hiatal hernia    HTN (hypertension)    Kidney stones    Osteoporosis    Thoracic aortic aneurysm ([place])    Dr. Roxan Hockey    Current Outpatient Medications on File Prior to Visit  Medication Sig Dispense Refill   acetaminophen (TYLENOL) 500 MG tablet Take 1,000 mg by mouth every 6 (six) hours as needed for moderate pain (back pain).      denosumab (PROLIA) 60 MG/ML SOSY injection Inject 60 mg into the skin every 6 (six) months.     Red Yeast Rice Extract (RED YEAST RICE PO) Take by mouth daily.     lisinopril (ZESTRIL) 5 MG tablet Take 5 mg by mouth 2 (two) times daily.     No current facility-administered medications on file prior to visit.    No current facility-administered medications for this visit.     Physical Exam Vitals:   07/15/21 1252  BP: 130/75  Pulse: 67  Resp: 20  SpO3: 22%    75 year old woman in no acute distress Alert and oriented x3 with no focal deficits Lungs clear with equal breath sounds bilaterally No carotid bruits or JVD Cardiac regular rate and rhythm with a normal S1 and S2, no rubs or murmurs No peripheral edema  Diagnostic Tests:   CLINICAL DATA:  Thoracic aortic aneurysm, follow-up   EXAM: CT ANGIOGRAPHY CHEST WITH CONTRAST   TECHNIQUE: Multidetector CT imaging of the chest was performed using the standard protocol during bolus administration of intravenous contrast. Multiplanar CT image reconstructions and MIPs were obtained to evaluate the vascular anatomy.   CONTRAST:  32mL ISOVUE-370 IOPAMIDOL (ISOVUE-370) INJECTION 76%   COMPARISON:  07/13/2020 and previous   FINDINGS: Cardiovascular: Heart size normal. No pericardial effusion. Satisfactory opacification of pulmonary arteries noted, and there is no evidence of pulmonary emboli. Scattered coronary calcifications. Good contrast opacification of the thoracic aorta without dissection or stenosis. Bovine variant brachiocephalic arterial origin anatomy without proximal stenosis. Mild scattered calcified plaque in the isthmus and descending thoracic aorta.   Aortic Root:   --Valve: 2.2 cm   --Sinuses: 4 cm   --  Sinotubular Junction: 3.4 cm   Limitations by motion: Moderate   Thoracic Aorta:   --Ascending Aorta: 4.1 cm   --Aortic Arch: 3.2 cm   --Descending Aorta: 3.1 cm   Mediastinum/Nodes: Small hiatal hernia.  No mass or adenopathy.   Lungs/Pleura: No pleural effusion. Linear scarring or subsegmental atelectasis in the inferior aspect of the right middle lobe and lingula.   6 mm ground-glass nodule superior segment right lower lobe (Im48,Se7) and 5 mm medial right middle lobe nodule image 61 both stable since 07/11/2016 presumably benign.   4 mm pleural based  ground-glass nodule posterior right upper lobe image 26, stable since most recent previous. No new nodules or infiltrate.   Upper Abdomen: Small hiatal hernia with surgical clips at the diaphragmatic hiatus. No acute findings.   Musculoskeletal: Bilateral breast implants. No chest wall pathology. No acute or significant osseous findings.   Review of the MIP images confirms the above findings.   IMPRESSION: 1. 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 Recommend annual imaging followup by CTA or MRA. This recommendation follows 2010 ACCF/AHA/AATS/ACR/ASA/SCA/SCAI/SIR/STS/SVM Guidelines for the Diagnosis and Management of Patients with Thoracic Aortic Disease. Circulation.2010; 121: W102-V253 2. Stable 4 mm right upper lobe lung nodule. Recommend continued attention on follow-up imaging. 3. Small hiatal hernia 4. Coronary and Aortic Atherosclerosis (ICD10-170.0).     Electronically Signed   By: Lucrezia Europe M.D.   On: 07/15/2021 09:43   CT ANGIOGRAPHY CHEST WITH CONTRAST   TECHNIQUE: Multidetector CT imaging of the chest was performed using the standard protocol during bolus administration of intravenous contrast. Multiplanar CT image reconstructions and MIPs were obtained to evaluate the vascular anatomy.   CONTRAST:  30mL ISOVUE-370 IOPAMIDOL (ISOVUE-370) INJECTION 76%   COMPARISON:  07/16/2019 and 07/10/2018   FINDINGS: Cardiovascular: Ascending thoracic aorta measures 3.9 cm and stable. Negative for an aortic dissection. Limited evaluation of the aortic root due to pulsation artifact. Common trunk for the left common carotid artery and brachiocephalic artery and this is a normal variant. Left vertebral artery originates directly from the aortic arch, distal to the left common carotid artery and this is also a normal variant. Great vessels are widely patent. Proximal descending thoracic aorta measures 2.8 cm and minimally changed. Main  pulmonary arteries are patent. No gross abnormality to the left atrium or pulmonary veins. Normal appearance of the left atrial appendage. Heart size is normal without significant pericardial fluid. Mild atherosclerotic disease along the undersurface of the aortic arch.   Mediastinum/Nodes: No mediastinal or hilar lymphadenopathy. Thyroid tissue is unremarkable. Surgical clip in the left axilla.   Lungs/Pleura: Trachea and mainstem bronchi are patent. Focal irregular density along the right major fissure in the superior segment of the right upper lower lobe on sequence 6, image 41 measures up to 7 mm. This density has been present on multiple examinations but slightly more conspicuous on this examination. Stable pleural-based nodular density along the posterior right upper lobe measuring 4 mm on sequence 6, image 21. Stable linear density in the anterior right middle lobe on sequence 6, image 87 likely represents scarring. 5 mm nodule in the medial right lower lobe on sequence 6 image 55 has not significantly changed since 07/19/2017. Probable atelectasis at the base of the lingula. No significant airspace disease or consolidation in the lungs. No pleural effusions.   Upper Abdomen: Postsurgical changes compatible with gastric fundoplication. No acute abnormality in the upper abdomen.   Musculoskeletal: No acute bone abnormality. Bilateral  breast implants.   Review of the MIP images confirms the above findings.   IMPRESSION: 1. Stable ectasia of the ascending thoracic aorta measuring up to 3.9 cm. No evidence for an aortic dissection. 2. Mild atherosclerotic disease in the thoracic aorta. Aortic Atherosclerosis (ICD10-I70.0). 3. Scattered pleural-based and peri-fissural densities in the lungs. These findings have not significantly changed and recommend continued attention on follow-up imaging.     Electronically Signed   By: Markus Daft M.D.   On: 07/13/2020  09:31   Impression:  Shulamis Wenberg is a 76 year old woman with a history of breast cancer, bilateral mastectomy, hiatal hernia, reflux, hypertension, kidney stones, osteoporosis, and a 4 cm ascending aneurysm.  She has been followed for a 4 cm ascending aneurysm for over 10 years now.  It remains stable.  She needs continued annual follow-up.  Stable 4 mm right upper lobe lung nodule. We will continue to follow on CTs as we follow the aneurysm.  Hypertension-well-controlled on current regimen.  Managed by Dr. Sharlett Iles. She does not have a cardiologist at this time.   Plan:  Return in 1 year with CT angio of chest to follow-up ascending aneurysm and upper right lobe lung nodule.   Nicholes Rough, PA-C [facility] [phone]

## 2022-06-02 ENCOUNTER — Other Ambulatory Visit: Payer: Self-pay | Admitting: Thoracic Surgery (Cardiothoracic Vascular Surgery)

## 2022-06-02 DIAGNOSIS — I7121 Aneurysm of the ascending aorta, without rupture: Secondary | ICD-10-CM

## 2022-07-15 NOTE — Progress Notes (Unsigned)
[address]       [place] [address]             [phone]                PCP is Donnajean Lopes, MD Referring Provider is Donnajean Lopes, MD   Chief Complaint: Ascending thoracic aortic aneurysm     HPI: This is a 76 year old female with a past medical history of hypertension, hiatal hernia, breast cancer, GERD, and osteoporosis who presents today for annual surveillance ascending thoracic aortic aneurysm. She was last seen on 07/15/2021 by Nicholes Rough PA-C, at the ATAA was 4.1 cm. She denies chest tightness, pain or pressure, or shortness of breath.  Past Medical History:  Diagnosis Date   Breast cancer [facility])    s/p bilateral mastectomies   GERD (gastroesophageal reflux disease)    history of   Hiatal hernia    HTN (hypertension)    Kidney stones    Osteoporosis    Thoracic aortic aneurysm    Dr. Roxan Hockey    Past Surgical History:  Procedure Laterality Date   ABDOMINAL HYSTERECTOMY  1984   BREAST RECONSTRUCTION     bilateral & implants   CESAREAN SECTION  1977   CHOLECYSTECTOMY     CYSTOSCOPY W/ URETERAL STENT PLACEMENT  07/26/2012   Procedure: CYSTOSCOPY WITH RETROGRADE PYELOGRAM/URETERAL STENT PLACEMENT;  Surgeon: Claybon Jabs, MD;  Location: [facility];  Service: Urology;  Laterality: Left;   GASTRIC FUNDOPLICATION     HEMORROIDECTOMY     MASTECTOMY  03/2008   bilateral   ROTATOR CUFF REPAIR  10 years ago    Left    STONE EXTRACTION WITH BASKET  07/26/2012   Procedure: STONE EXTRACTION WITH BASKET;  Surgeon: Claybon Jabs, MD;  Location: [facility];  Service: Urology;  Laterality: Left;   URETEROSCOPY  07/26/2012   Procedure: URETEROSCOPY;  Surgeon: Claybon Jabs, MD;  Location: [facility];  Service: Urology;  Laterality: Left;    Family History  Problem Relation Age of Onset   Diabetes Mother    Heart disease Father    Hyperlipidemia Sister    Diabetes Sister    Diabetes Sister    Hyperlipidemia Sister     Social  History Social History   Tobacco Use   Smoking status: Never   Smokeless tobacco: Never  Substance Use Topics   Alcohol use: No   Drug use: No    Current Outpatient Medications  Medication Sig Dispense Refill   acetaminophen (TYLENOL) 500 MG tablet Take 1,000 mg by mouth every 6 (six) hours as needed for moderate pain (back pain).      denosumab (PROLIA) 60 MG/ML SOSY injection Inject 60 mg into the skin every 6 (six) months.     lisinopril (ZESTRIL) 5 MG tablet Take 5 mg by mouth 2 (two) times daily.     Red Yeast Rice Extract (RED YEAST RICE PO) Take by mouth daily.     No current facility-administered medications for this visit.    Allergies  Allergen Reactions   Ceclor [Cefaclor] Rash    All over body.   Sulfa Antibiotics     Ulcer in mouth   Codeine Anxiety   Review of Systems Cardiac Review of Systems: Y or N  Chest Pain [  ] Resting SOB [  ] Exertional SOB [  ]  Pedal Edema [  ] Palpitations '[ ]'$  Syncope [  ] Presyncope [  ]  General Review of Systems: [Y] = yes [ N]=no  Constitional:  fatigue [  ]; nausea '[ ]'$ ;  fever '[ ]'$ ;  Eye : blurred vision '[ ]'$ ; Amaurosis fugax'[ ]'$ ;  Resp: cough '[ ]'$ ; wheezing'[ ]'$ ; hemoptysis'[ ]'$ ;  GI: vomiting'[ ]'$ ; melena'[ ]'$ ; hematochezia '[ ]'$ ;  GU:hematuria'[ ]'$ ; Musculoskeletal: myalgias'[ ]'$ ; joint swelling[  ]; joint erythema'[ ]'$ ;  Heme/Lymph: anemia[ Y];  Neuro: TIA'[ ]'$ ;stroke'[ ]'$ ;  seizures'[ ]'$ ;  Endocrine: diabetes[ N];   Physical Exam: CV Pulmonary Abdomen Extremities Neurologic   Diagnostic Tests:   Impression and Plan: We discussed the results of the patient's most recent CTA results. Of note, Patient also has a 4 mm pleural based ground-glass nodule posterior right upper lobe and  and 5 mm medial right middle lobe nodule. We reviewed the risk modifications for ATAA (will be provided a summary of this with AVS).  Risk Modification in those with ascending thoracic aortic aneurysm:  Continue good control of blood pressure (prefer  SBP 130/80 or less)  2. Avoid fluoroquinolone antibiotics (I.e Ciprofloxacin, Avelox, Levofloxacin, Ofloxacin)  3.  Use of statin (to decrease cardiovascular risk)  4.  Exercise and activity limitations is individualized, but in general, contact sports are to be  avoided and one should avoid heavy lifting (defined as half of ideal body weight) and exercises involving sustained Valsalva maneuver.  5. Counseling for those suspected of having genetically mediated disease. First-degree relatives of those with TAA disease should be screened as well as those who have a connective tissue disease (I.e with Marfan syndrome, Ehlers-Danlos syndrome,  and Loeys-Dietz syndrome) or a  bicuspid aortic valve,have an increased risk for  complications related to TAA  6. If one has tobacco abuse, smoking cessation is highly encouraged.     [facility]:[id number]$, PA-C [facility] [phone]

## 2022-07-15 NOTE — Progress Notes (Deleted)
[address]       [place] [address]             [phone]    PCP is Donnajean Lopes, MD Referring Provider is Donnajean Lopes, MD  Chief Complaint: Ascending thoracic aortic aneurysm   HPI: This is a 76 year old female with a past medical history of hypertension, hiatal hernia, breast cancer, GERD, and osteoporosis who presents today for annual surveillance ascending thoracic aortic aneurysm. She was last seen on 07/15/2021 by Dr. Roxan Hockey, at the ATAA was 4.1 cm. She denies chest pain or pressure, back pain, or shortness of breath.   Past Medical History:  Diagnosis Date   Breast cancer [facility])    s/p bilateral mastectomies   GERD (gastroesophageal reflux disease)    history of   Hiatal hernia    HTN (hypertension)    Kidney stones    Osteoporosis    Thoracic aortic aneurysm    Dr. Roxan Hockey    Past Surgical History:  Procedure Laterality Date   ABDOMINAL HYSTERECTOMY  1984   BREAST RECONSTRUCTION     bilateral & implants   CESAREAN SECTION  1977   CHOLECYSTECTOMY     CYSTOSCOPY W/ URETERAL STENT PLACEMENT  07/26/2012   Procedure: CYSTOSCOPY WITH RETROGRADE PYELOGRAM/URETERAL STENT PLACEMENT;  Surgeon: Claybon Jabs, MD;  [facility];  Service: Urology;  Laterality: Left;   GASTRIC FUNDOPLICATION     HEMORROIDECTOMY     MASTECTOMY  03/2008   bilateral   ROTATOR CUFF REPAIR  10 years ago    Left    STONE EXTRACTION WITH BASKET  07/26/2012   Procedure: STONE EXTRACTION WITH BASKET;  Surgeon: Claybon Jabs, MD;  [facility];  Service: Urology;  Laterality: Left;   URETEROSCOPY  07/26/2012   Procedure: URETEROSCOPY;  Surgeon: Claybon Jabs, MD;  [facility];  Service: Urology;  Laterality: Left;    Family History  Problem Relation Age of Onset   Diabetes Mother    Heart disease Father    Hyperlipidemia Sister    Diabetes Sister    Diabetes Sister    Hyperlipidemia Sister     Social History Social  History   Tobacco Use   Smoking status: Never   Smokeless tobacco: Never  Substance Use Topics   Alcohol use: No   Drug use: No       Allergies  Allergen Reactions   Ceclor [Cefaclor] Rash    All over body.   Sulfa Antibiotics     Ulcer in mouth   Codeine Anxiety   Review of Systems: No orthopnea No ankle edema No PND   Physical Exam: CV Pulmonary Abdomen Extremities Neurologic   Diagnostic Tests:   Impression and Plan:      [address]       [place] [address]             [place] [phone] 1946/01/05      History of Present Illness:       Current Outpatient Medications on File Prior to Visit  Medication Sig Dispense Refill   acetaminophen (TYLENOL) 500 MG tablet Take 1,000 mg by mouth every 6 (six) hours as needed for moderate pain (back pain).      denosumab (PROLIA) 60 MG/ML SOSY injection Inject 60 mg into the skin every 6 (six) months.     lisinopril (ZESTRIL) 5 MG  tablet Take 5 mg by mouth 2 (two) times daily.     Red Yeast Rice Extract (RED YEAST RICE PO) Take by mouth daily.     No current facility-administered medications on file prior to visit.     There were no vitals taken for this visit.  Physical Exam  CTA Results:      A/P:      Risk Modification:  Statin:  ***  Smoking cessation instruction/counseling given:  {CHL AMB [facility] SMOKING CESSATION COUNSELING:20758}  Patient was counseled on importance of Blood Pressure Control.  Despite Medical intervention if the patient notices persistently elevated blood pressure readings.  They are instructed to contact their Primary Care Physician  Please avoid use of Fluoroquinolones as this can potentially increase your risk of Aortic Rupture and/or Dissection  Patient educated on signs and symptoms of Aortic Dissection, handout also provided in AVS  [place], PA-C 07/15/22        Nani Skillern,  PA-C [facility] [phone]

## 2022-07-15 NOTE — Progress Notes (Deleted)
     [address]       [place] [address]             [phone]                PCP is Donnajean Lopes, MD Referring Provider is Donnajean Lopes, MD   Chief Complaint: Ascending thoracic aortic aneurysm     HPI: This is a 76 year old female with a past medical history of hypertension, hiatal hernia, breast cancer, GERD, and osteoporosis who presents today for annual surveillance ascending thoracic aortic aneurysm. She was last seen on 07/15/2021 by Dr. Roxan Hockey, at the ATAA was 4.1 cm. She denies chest pain or pressure, back pain, or shortness of breath.

## 2022-07-18 ENCOUNTER — Ambulatory Visit: Payer: Medicare Other | Admitting: Physician Assistant

## 2022-07-18 ENCOUNTER — Ambulatory Visit
Admission: RE | Admit: 2022-07-18 | Discharge: 2022-07-18 | Disposition: A | Discharge status: Home / Self Care | Payer: Medicare Other | Source: Ambulatory Visit | Attending: Thoracic Surgery (Cardiothoracic Vascular Surgery) | Admitting: Thoracic Surgery (Cardiothoracic Vascular Surgery)

## 2022-07-18 VITALS — BP 145/87 | HR 80 | Resp 96 | Ht 62.0 in | Wt 122.8 lb

## 2022-07-18 DIAGNOSIS — I7121 Aneurysm of the ascending aorta, without rupture: Secondary | ICD-10-CM

## 2022-07-18 MED ORDER — IOPAMIDOL (ISOVUE-370) INJECTION 76%
75.0000 mL | Freq: Once | INTRAVENOUS | Status: AC | PRN
  Administered 2022-07-18: 75 mL via INTRAVENOUS

## 2022-07-18 NOTE — Patient Instructions (Signed)
Risk Modification in those with ascending thoracic aortic aneurysm:  Good control of blood pressure (prefer SBP 130/80 or less)-on Lisinopril 5 mg bid.   2. Avoid fluoroquinolone antibiotics (I.e Ciprofloxacin, Avelox, Levofloxacin, Ofloxacin)  3.  Use of statin (to decrease cardiovascular risk)-has recently had lipid profile. Continue to monitor need for statin  4.  Exercise and activity limitations is individualized, but in general, contact sports are to be avoided and one should avoid heavy lifting (defined as half of ideal body weight) and exercises involving sustained Valsalva maneuver.  5. Counseling for those suspected of having genetically mediated disease. First-degree relatives of those with TAA disease should be screened as well as those who have a connective tissue disease (I.e with Marfan syndrome, Ehlers-Danlos syndrome, and Loeys-Dietz syndrome) or a  bicuspid aortic valve,have an increased risk for complications related to TAA. Per patient, does not have any of the aforementioned  6. She does not have a history of tobacco abuse.

## 2022-07-19 ENCOUNTER — Ambulatory Visit: Payer: Medicare Other

## 2023-06-05 ENCOUNTER — Other Ambulatory Visit: Payer: Self-pay | Admitting: Thoracic Surgery (Cardiothoracic Vascular Surgery)

## 2023-06-05 DIAGNOSIS — I7121 Aneurysm of the ascending aorta, without rupture: Secondary | ICD-10-CM

## 2023-08-01 ENCOUNTER — Ambulatory Visit: Payer: Medicare Other | Admitting: Surgical

## 2023-08-01 ENCOUNTER — Ambulatory Visit
Admission: RE | Admit: 2023-08-01 | Discharge: 2023-08-01 | Disposition: A | Discharge status: Home / Self Care | Payer: Medicare Other | Source: Ambulatory Visit | Attending: Thoracic Surgery (Cardiothoracic Vascular Surgery) | Admitting: Thoracic Surgery (Cardiothoracic Vascular Surgery)

## 2023-08-01 VITALS — BP 120/78 | HR 60 | Resp 20 | Wt 123.8 lb

## 2023-08-01 DIAGNOSIS — I7121 Aneurysm of the ascending aorta, without rupture: Secondary | ICD-10-CM

## 2023-08-01 MED ORDER — IOPAMIDOL (ISOVUE-370) INJECTION 76%
100.0000 mL | Freq: Once | INTRAVENOUS | Status: AC | PRN
  Administered 2023-08-01: 100 mL via INTRAVENOUS

## 2023-08-01 NOTE — Progress Notes (Signed)
Subjective:     Patient ID: [name], female    DOB: 20-Jun-1946, 77 y.o.   MRN: [id number]  Chief Complaint  Patient presents with   Thoracic Aortic Aneurysm    Year f/u with CTA chest    HPI Patient is in today for follow-up of an ascending aortic aneurysm.  Did have a repeat CT a done today but the results are unavailable as well as the films currently.  She remains quite active and healthy in retirement.  Pressure has been under good control.  She has rare chest discomfort that attributes to GI/indigestion.  They are not associated with any other cardiac symptoms.  Past Medical History:  Diagnosis Date   Breast cancer [facility])    s/p bilateral mastectomies   GERD (gastroesophageal reflux disease)    history of   Hiatal hernia    HTN (hypertension)    Kidney stones    Osteoporosis    Thoracic aortic aneurysm [facility])    Dr. Dorris Fetch     Patient Active Problem List   Diagnosis Date Noted   Anxiety 12/27/2013   GERD (gastroesophageal reflux disease) 12/27/2013   History of kidney stones 12/27/2013   Hypertension 12/27/2013   Droopy eyelid 10/31/2013   Ascending aortic aneurysm (HCC) 12/04/2012   Lung nodule 12/04/2012   Solitary pulmonary nodule 12/04/2012   Breast cancer (HCC) 02/09/2012    Past Surgical History:  Procedure Laterality Date   ABDOMINAL HYSTERECTOMY  1984   BREAST RECONSTRUCTION     bilateral & implants   CESAREAN SECTION  1977   CHOLECYSTECTOMY     CYSTOSCOPY W/ URETERAL STENT PLACEMENT  07/26/2012   Procedure: CYSTOSCOPY WITH RETROGRADE PYELOGRAM/URETERAL STENT PLACEMENT;  Surgeon: Garnett Farm, MD;  [facility];  Service: Urology;  Laterality: Left;   GASTRIC FUNDOPLICATION     HEMORROIDECTOMY     MASTECTOMY  03/2008   bilateral   ROTATOR CUFF REPAIR  10 years ago    Left    STONE EXTRACTION WITH BASKET  07/26/2012   Procedure: STONE EXTRACTION WITH BASKET;  Surgeon: Garnett Farm, MD;  [facility];  Service: Urology;   Laterality: Left;   URETEROSCOPY  07/26/2012   Procedure: URETEROSCOPY;  Surgeon: Garnett Farm, MD;  [facility];  Service: Urology;  Laterality: Left;     Allergies  Allergen Reactions   Ceclor [Cefaclor] Rash    All over body.   Nitrofurantoin Itching   Sulfa Antibiotics Swelling    Ulcer in mouth   Codeine Anxiety     Current Outpatient Medications:    acetaminophen (TYLENOL) 500 MG tablet, Take 1,000 mg by mouth every 6 (six) hours as needed for moderate pain (back pain). , Disp: , Rfl:    busPIRone (BUSPAR) 10 MG tablet, Take 10 mg by mouth 2 (two) times daily as needed., Disp: , Rfl:    Red Yeast Rice Extract (RED YEAST RICE PO), Take by mouth daily., Disp: , Rfl:    Social History   Occupational History   Not on file  Tobacco Use   Smoking status: Never   Smokeless tobacco: Never  Substance and Sexual Activity   Alcohol use: No   Drug use: No   Sexual activity: Not on file     Allergies  Allergen Reactions   Ceclor [Cefaclor] Rash    All over body.   Nitrofurantoin Itching   Sulfa Antibiotics Swelling    Ulcer in mouth   Codeine  Anxiety        Objective:    BP 120/78 (BP Location: Right Arm, Patient Position: Sitting, Cuff Size: Normal)   Pulse 60   Resp 20   Wt 123 lb 12.8 oz (56.2 kg)   SpO2 94% Comment: RA  BMI 22.64 kg/m  BP Readings from Last 3 Encounters:  08/01/23 120/78  07/18/22 (!) 145/87  07/15/21 130/75   Wt Readings from Last 3 Encounters:  08/01/23 123 lb 12.8 oz (56.2 kg)  07/18/22 122 lb 12.8 oz (55.7 kg)  07/15/21 132 lb 3.2 oz (60 kg)      Physical Exam Constitutional:      Appearance: Normal appearance. She is not ill-appearing.  HENT:     Mouth/Throat:     Mouth: Mucous membranes are moist.  Eyes:     General: No scleral icterus.    Extraocular Movements: Extraocular movements intact.     Pupils: Pupils are equal, round, and reactive to light.  Neck:     Vascular: No carotid bruit.  Cardiovascular:      Rate and Rhythm: Normal rate and regular rhythm.     Heart sounds: No murmur heard. Pulmonary:     Effort: Pulmonary effort is normal.     Breath sounds: Normal breath sounds.  Abdominal:     Comments: Benign exam  Musculoskeletal:        General: No swelling.     Right lower leg: No edema.     Left lower leg: No edema.  Skin:    Coloration: Skin is not jaundiced.     Findings: No rash.  Neurological:     General: No focal deficit present.     Mental Status: She is alert.     No results found for any visits on 08/01/23.      Assessment & Plan:  Ascending aneurysm that we have been monitoring for greater than 10 years which has been measuring up to 4 cm with no significant change over time.  Her scan done today is not available for either viewing or radiology read.  I will contact her regarding the results should there be any significant changes.  She also has some small pulmonary nodules that we have been following and we will evaluate them as well.    Problem List Items Addressed This Visit     Ascending aortic aneurysm (HCC) - Primary    No orders of the defined types were placed in this encounter.     Rowe Clack, PA-C

## 2023-08-01 NOTE — Patient Instructions (Signed)
Continue healthy lifestyle and good blood pressure control.
